# Patient Record
Sex: Male | Born: 1970
Health system: Southern US, Community
[De-identification: ages and names within clinical notes are randomized; demographics above are authoritative.]

## PROBLEM LIST (undated history)

## (undated) DIAGNOSIS — A6000 Herpesviral infection of urogenital system, unspecified: Secondary | ICD-10-CM

## (undated) DIAGNOSIS — I4891 Unspecified atrial fibrillation: Principal | ICD-10-CM

## (undated) DIAGNOSIS — G4733 Obstructive sleep apnea (adult) (pediatric): Secondary | ICD-10-CM

## (undated) DIAGNOSIS — A630 Anogenital (venereal) warts: Secondary | ICD-10-CM

## (undated) DIAGNOSIS — I4729 Other ventricular tachycardia: Secondary | ICD-10-CM

## (undated) DIAGNOSIS — I472 Ventricular tachycardia: Secondary | ICD-10-CM

## (undated) DIAGNOSIS — J309 Allergic rhinitis, unspecified: Secondary | ICD-10-CM

## (undated) DIAGNOSIS — N4340 Spermatocele of epididymis, unspecified: Secondary | ICD-10-CM

## (undated) DIAGNOSIS — M5126 Other intervertebral disc displacement, lumbar region: Secondary | ICD-10-CM

## (undated) HISTORY — DX: Spermatocele of epididymis, unspecified: N43.40

## (undated) HISTORY — DX: Other ventricular tachycardia: I47.29

## (undated) HISTORY — DX: Ventricular tachycardia: I47.2

## (undated) HISTORY — DX: Unspecified atrial fibrillation: I48.91

## (undated) HISTORY — DX: Anogenital (venereal) warts: A63.0

## (undated) HISTORY — DX: Other intervertebral disc displacement, lumbar region: M51.26

## (undated) HISTORY — DX: Herpesviral infection of urogenital system, unspecified: A60.00

## (undated) HISTORY — DX: Obstructive sleep apnea (adult) (pediatric): G47.33

## (undated) HISTORY — DX: Allergic rhinitis, unspecified: J30.9

---

## 2000-01-05 HISTORY — PX: SHOULDER ARTHROSCOPY W/ SUPERIOR LABRAL ANTERIOR POSTERIOR LESION REPAIR: SHX2402

## 2006-04-08 ENCOUNTER — Emergency Department (HOSPITAL_COMMUNITY): Admission: EM | Admit: 2006-04-08 | Discharge: 2006-04-08 | Payer: Self-pay | Admitting: Emergency Medicine

## 2010-01-28 ENCOUNTER — Encounter
Admission: RE | Admit: 2010-01-28 | Discharge: 2010-01-28 | Payer: Self-pay | Source: Home / Self Care | Attending: Family Medicine | Admitting: Family Medicine

## 2012-02-07 ENCOUNTER — Encounter: Payer: Self-pay | Admitting: Internal Medicine

## 2012-02-07 ENCOUNTER — Ambulatory Visit (INDEPENDENT_AMBULATORY_CARE_PROVIDER_SITE_OTHER): Payer: BC Managed Care – PPO | Admitting: Internal Medicine

## 2012-02-07 VITALS — BP 139/77 | HR 60 | Ht 73.0 in | Wt 208.4 lb

## 2012-02-07 DIAGNOSIS — R55 Syncope and collapse: Secondary | ICD-10-CM

## 2012-02-07 DIAGNOSIS — IMO0001 Reserved for inherently not codable concepts without codable children: Secondary | ICD-10-CM | POA: Insufficient documentation

## 2012-02-07 DIAGNOSIS — R569 Unspecified convulsions: Secondary | ICD-10-CM

## 2012-02-07 NOTE — Patient Instructions (Addendum)
Your physician has recommended that you wear a 30 day patient activated event monitor. Event monitors are medical devices that record the heart's electrical activity. Doctors most often [place] these monitors to diagnose arrhythmias. Arrhythmias are problems with the speed or rhythm of the heartbeat. The monitor is a small, portable device. You can wear one while you do your normal daily activities. This is usually used to diagnose what is causing palpitations/syncope (passing out).

## 2012-02-07 NOTE — Assessment & Plan Note (Signed)
The fact that his ECG is relatively normal and that his echo was also normal are reassuring. If the aforementioned evaluation is on enlightening, given his syncope, I may choose to undertake an MR

## 2012-02-07 NOTE — Assessment & Plan Note (Signed)
The spells are quite unusual.  The abrupt onset and  the subjective nature of the impairment of exercise and the associated palpitations make me wonder whether there is an arrhythmia associated with this. An event recorder will be helpful in clarifying the presence or absence of such.  The fact that he has shower intolerance, The syncopal episode following the sauna, and that his blood pressure fell and then rebounded post exercise make me wonder whether this is a dysautonomic syndrome. Against this is the fact that there were symptoms while in the midst of playing tennis not just in between shots. Still, we'll have to wait and see what the event recorder shows

## 2012-02-07 NOTE — Progress Notes (Signed)
ELECTROPHYSIOLOGY CONSULT NOTE  Patient ID: Alejandro Hayes, MRN: [id number], DOB/AGE: 1970/08/24 42 y.o. Admit date: (Not on file) Date of Consult: 02/07/2012  Primary Physician: Cala Bradford, MD Primary Cardiologist: [facility] Chief Complaint:  spells   HPI Alejandro Hayes is a 42 y.o. male  Seen at the request of Dr. Katrinka Blazing because of spells associated with exercise.  He is a have been exercising gentleman playing tennis swimming running etc. Beginning this summer, July-August timeframe, he began noting spells that occurred while exercising primarily. He would notice it in between tennis shots were he would find himself having to catch his breath and a sensation that his heart was racing. There were some lightheadedness with this. He noted this particularly when he was going to serve. He also had one episode of lightheadedness during a volleying   clinic while playing tennis. He's had one episode of shower intolerance.  He had an episode of syncope. This occurred following swimming and spending time in a steam room afterwards. He had a steam room and was walking out in loss consciousness falling against the wall. This is his only episode of syncope.  He has had episodes recently doing things simply as raking leaves.  He was seen by Dr. Jillyn Hidden who was concerned about possible dehydration and encourage salt and water intake. This seemed to have no mitigating impact.  He was seen by Dr. MS who undertook a a stress test and echo that were unrevealing (see below)  He has noted no systemic symptoms of fever or weight loss.        Past Medical History  Diagnosis Date  . Genital herpes   . Lumbar herniated disc   . Allergic rhinitis   . Genital warts   . Spermatocele     right      Surgical History:  Past Surgical History  Procedure Date  . Shoulder arthroscopy w/ superior labral anterior posterior lesion repair 2002    right     Home Meds: Prior to Admission medications   Not  on File      Allergies: No Known Allergies  History   Social History  . Marital Status: Married    Spouse Name: N/A    Number of Children: N/A  . Years of Education: N/A   Occupational History  . Not on file.   Social History Main Topics  . Smoking status: Former Games developer  . Smokeless tobacco: Not on file     Comment: quit 20 yrs ago  . Alcohol Use: Not on file  . Drug Use: Not on file  . Sexually Active: Not on file   Other Topics Concern  . Not on file   Social History Narrative  . No narrative on file     Family History  Problem Relation Age of Onset  . Hypertension Father   . Stroke Father 57    left optic nerve-lost visioin  . Arthritis Mother     rheumatoid  . Alzheimer's disease Paternal Grandfather 17  . Cancer Paternal Grandmother     cancer of the triceps muscle  . Alzheimer's disease Maternal Grandfather   . Diabetes Maternal Grandmother      ROS:  Please see the history of present illness.     All other systems reviewed and negative.    Physical Exam:  Blood pressure 139/77, pulse 60, height 6\' 1"  (1.854 m), weight 208 lb 6.4 oz (94.53 kg). General: Well developed, well nourished male in no acute  distress. Head: Normocephalic, atraumatic, sclera non-icteric, no xanthomas, nares are without discharge. Lymph Nodes:  none Back: without scoliosis/kyphosis , no CVA tendersness Neck: Negative for carotid bruits. JVD not elevated. Lungs: Clear bilaterally to auscultation without wheezes, rales, or rhonchi. Breathing is unlabored. Heart: RRR with S1 S2. No  murmur , rubs, or gallops appreciated. Abdomen: Soft, non-tender, non-distended with normoactive bowel sounds. No hepatomegaly. No rebound/guarding. No obvious abdominal masses. Msk:  Strength and tone appear normal for age. Extremities: No clubbing or cyanosis. No edema.  Distal pedal pulses are 2+ and equal bilaterally. Skin: Warm and Dry Neuro: Alert and oriented X 3. CN III-XII intact Grossly  normal sensory and motor function . Psych:  Responds to questions appropriately with a normal affect.       d.  EKG  sinus rhythm at 61 Intervals 16/10/39 Voltage suggestive of left ventricular hypertrophy with nonspecific T-wave changes.  Treadmill report from Dr. Champ Mungo was reviewed. Blood pressure response to exercise was normal. There is a modest change in blood pressure post exercise [id number]. There are no symptoms associated with the treadmill. Echocardiogram from [facility] was reviewed. He reports normal function and chamber sizes. No significant valvular abnormalities were appreciated   Asessment and Plan:  Sherryl Manges

## 2012-02-11 ENCOUNTER — Ambulatory Visit (INDEPENDENT_AMBULATORY_CARE_PROVIDER_SITE_OTHER): Payer: BC Managed Care – PPO

## 2012-02-11 DIAGNOSIS — R55 Syncope and collapse: Secondary | ICD-10-CM

## 2012-02-11 NOTE — Progress Notes (Signed)
Placed a 30 days  monitor on patient and went over instruction on how to use and when to return it 

## 2012-02-14 ENCOUNTER — Telehealth: Payer: Self-pay | Admitting: *Deleted

## 2012-02-14 MED ORDER — METOPROLOL SUCCINATE ER 25 MG PO TB24: 25.0000 mg | ORAL_TABLET | Freq: Every day | ORAL | Status: DC

## 2012-02-14 NOTE — Telephone Encounter (Signed)
Received rhythm strips from [facility] dated 02/13/2012. Patient just finished running and went into SVT. Discussed with Dr.Katz (DOD) and he advised that patient start Metoptolol XL 25mg  1 time per day. Patient aware of above. He will continue using monitor and keep up with his regular exercise routine. Will let Dr.Klein know.

## 2012-02-23 ENCOUNTER — Institutional Professional Consult (permissible substitution): Payer: Self-pay | Admitting: Internal Medicine

## 2012-02-23 NOTE — Telephone Encounter (Signed)
Strips reviewed by Dr. Graciela Husbands- reading was sinus tach.

## 2012-03-08 ENCOUNTER — Telehealth: Payer: Self-pay | Admitting: Cardiology

## 2012-03-08 NOTE — Telephone Encounter (Signed)
Received a call from [facility]. Alejandro Hayes has been wearing an event monitor since about 2/9 and has been noted to have SVT for which Dr. Myrtis Ser initiated metoprolol on 2/19. [facility] noted an episode of 9 beats of NSVT at 219bpm this evening. I called Alejandro Hayes who reports he was running on his treadmill during the episode. He did feel lightheaded and palpitations during this episode. He is currently feeling well without complaints. No chest pain, sob, or syncope. I advised him to call our office to arrange for follow up with Dr. Graciela Husbands. If he develops worsening or sustained symptoms he is aware that he should seek immediate medical attention.  [name], PA-C 03/08/2012 7:46 PM

## 2012-03-22 ENCOUNTER — Ambulatory Visit (INDEPENDENT_AMBULATORY_CARE_PROVIDER_SITE_OTHER): Payer: BC Managed Care – PPO | Admitting: Internal Medicine

## 2012-03-22 ENCOUNTER — Encounter: Payer: Self-pay | Admitting: Internal Medicine

## 2012-03-22 ENCOUNTER — Ambulatory Visit (HOSPITAL_COMMUNITY)
Admission: RE | Admit: 2012-03-22 | Discharge: 2012-03-22 | Disposition: A | Discharge status: Home / Self Care | Payer: BC Managed Care – PPO | Source: Ambulatory Visit | Attending: Internal Medicine | Admitting: Internal Medicine

## 2012-03-22 VITALS — BP 118/72 | HR 60 | Ht 73.0 in | Wt 205.0 lb

## 2012-03-22 DIAGNOSIS — R55 Syncope and collapse: Secondary | ICD-10-CM

## 2012-03-22 DIAGNOSIS — I472 Ventricular tachycardia: Secondary | ICD-10-CM

## 2012-03-22 DIAGNOSIS — R9431 Abnormal electrocardiogram [ECG] [EKG]: Secondary | ICD-10-CM | POA: Insufficient documentation

## 2012-03-22 MED ORDER — DILTIAZEM HCL ER COATED BEADS 120 MG PO CP24: 120.0000 mg | ORAL_CAPSULE | Freq: Every day | ORAL | Status: DC

## 2012-03-22 NOTE — Assessment & Plan Note (Signed)
No intercurrent syncope  Symptoms were improved with betaablockers

## 2012-03-22 NOTE — Progress Notes (Signed)
Patient Care Team: Cala Bradford, MD as PCP - General (Family Medicine)   HPI  Alejandro Hayes is a 42 y.o. male Seen in followup for spells associated with exercise. He is quite active and would note that while playing tennis to have spells where his heart was racing and it was difficult to catch his breath. Stress testing and echocardiography were unrevealing. He was given an event recorder Number of issues with her beta blockers have arisen including cold hands and feet  Past Medical History  Diagnosis Date  . Genital herpes   . Lumbar herniated disc   . Allergic rhinitis   . Genital warts   . Spermatocele     right    Past Surgical History  Procedure Laterality Date  . Shoulder arthroscopy w/ superior labral anterior posterior lesion repair  2002    right    Current Outpatient Prescriptions  Medication Sig Dispense Refill  . metoprolol succinate (TOPROL XL) 25 MG 24 hr tablet Take 1 tablet (25 mg total) by mouth daily.  30 tablet  1   No current facility-administered medications for this visit.    No Known Allergies  Review of Systems negative except from HPI and PMH  Physical Exam BP 118/72  Pulse 60  Ht 6\' 1"  (1.854 m)  Wt 205 lb (92.987 kg)  BMI 27.05 kg/m2 Well developed and well nourished in no acute distress  Event recorder demonstrates sinus tachycardia PVC and nonsustained ventricular tachycardia for morphology suggestive of a left bundle inferior axis    Assessment and  Plan

## 2012-03-22 NOTE — Patient Instructions (Addendum)
START DILTIAZEM 120 MG ONCE DAILY  SINGLE AVERAGE ECG AT THE ECG LAB AT [facility]

## 2012-03-22 NOTE — Assessment & Plan Note (Addendum)
The patient has been enrolled nonsustained ventricular tachycardia that have a morphology suggestive of left bundle branch block inferior axis. ECG and echo are normal. The differential diagnosis is arrhythmogenic RV cardiomyopathy. To that exclusion we'll undertake a signal average ECG. There is a recent paper from [facility] regarding incremental value of MRI which I will need to review. We spent about 40 minutes discussing the effects of the beta blockers the implications of ventricular tachycardia in the presence or absence of heart disease. At this point as I think his heart is normal, we will change his beta blocker to calcium blocker, diltiazem 120, and he is permitted to run.  We also discussed catheter ablation. He is not particularly inclined to long-term medication. I will talk to Dr. Jeannetta Nap about an appointment for catheter ablation

## 2012-03-27 ENCOUNTER — Telehealth: Payer: Self-pay | Admitting: Internal Medicine

## 2012-03-27 NOTE — Telephone Encounter (Signed)
Please return call to patient at [phone] regarding test results and possible ablation as previously discussed with Dr. Graciela Husbands

## 2012-03-27 NOTE — Telephone Encounter (Signed)
SPOKE WITH PT IS THINKING  ABOUT  PROCEEDING WITH ABLATION  REVIEWED  LAST OFFICE NOTE DR Graciela Husbands TO  DISCUSS  WITH   DR Ladona Ridgel OR DR ALLRED ALSO PT  WOULD LIKE TO SEE HOW HE DOES  ON  30 DAYS OF DILTIAZEM HAS NOT EXERCISED  WITH THIS MED HAS NOT NOTED AS MUCH BREAK TROUGH WITH THIS MED ,  WILL FORWARD TO DR Graciela Husbands FOR REVIEW .Alejandro Hayes

## 2012-03-31 NOTE — Telephone Encounter (Signed)
New Prob    C/o of heart racing, tachycardia, palpatations. Stressful/emotional situations cause his heart to suffer. Concerned and would like to speak to nurse about this and how to move forward.

## 2012-03-31 NOTE — Telephone Encounter (Signed)
Patient called because he has had several episodes of tachycardia yesterday and today, lasting 30 seconds and closed to a minute. Pt states stress triggers these episodes. Patient states he is under a lot of stress now. Pt would like to know if he can take extra medication for his tachycardia . Pt takes Cardizem CD 120 mg once a day . Pt also said that he did had less episodes of tachycardia when he was taken  Beta blockers than with Calcium channel blockers. Pt is not able to check his BP and pulse because he is in the hospital with his sick parents. Dr. Clifton James DOD recommended for pt to take an extra dose of Cardizem CD 120 mg  today and during the weekend if needed. Pt is aware that he can call the on call person, this weekend if needed.  DOD would like this message to be send to Dr. Graciela Husbands, so he can be aware of DOD's recommendations.

## 2012-04-02 NOTE — Telephone Encounter (Signed)
We should resume betablocker  toprol at 50 mg day And see how it does

## 2012-04-03 NOTE — Telephone Encounter (Signed)
Pt is aware to start Metoprolol 50 mg daily, and see how it does, per Dr Graciela Husbands MD. Pt to stop taken the Cardizem CD 120 mg. Pt verbalized understanding. Pt will used the medication he has , will take 2/ 25 mg Metoprolol one daily. Pt will call for prescription, because he wants to know if he tolerates the 50 mg dose  first  Before getting any refills.

## 2012-04-25 ENCOUNTER — Other Ambulatory Visit: Payer: Self-pay | Admitting: Emergency Medicine

## 2012-04-25 MED ORDER — METOPROLOL SUCCINATE ER 25 MG PO TB24: 25.0000 mg | ORAL_TABLET | Freq: Every day | ORAL | Status: DC

## 2012-04-27 ENCOUNTER — Other Ambulatory Visit: Payer: Self-pay | Admitting: *Deleted

## 2012-04-27 MED ORDER — METOPROLOL SUCCINATE ER 25 MG PO TB24: 25.0000 mg | ORAL_TABLET | Freq: Every day | ORAL | Status: DC

## 2012-05-03 ENCOUNTER — Ambulatory Visit: Payer: BC Managed Care – PPO | Admitting: Internal Medicine

## 2012-05-23 ENCOUNTER — Other Ambulatory Visit: Payer: Self-pay | Admitting: *Deleted

## 2012-05-23 MED ORDER — METOPROLOL SUCCINATE ER 25 MG PO TB24: 37.5000 mg | ORAL_TABLET | Freq: Every day | ORAL | Status: DC

## 2012-05-23 NOTE — Telephone Encounter (Signed)
Returned patient's call about refill of Metoprolol 25 XL. Refill was sent for one tablet daily when last telephone encounter states one tablet twice daily. Patient stated he is actually taking 1.5 tablets daily and having no problems with this dose. [employer] told him to have [place] Teacher, English as a foreign language) to resend the correct rx so his insurance will cover his medication.  Metoprolol XL 25mg , 37.5 (1.5 tabs) one daily by mouth, #[id number]  Micki Riley, CMA

## 2012-07-04 DIAGNOSIS — I4891 Unspecified atrial fibrillation: Secondary | ICD-10-CM

## 2012-07-04 HISTORY — DX: Unspecified atrial fibrillation: I48.91

## 2012-07-20 ENCOUNTER — Ambulatory Visit (INDEPENDENT_AMBULATORY_CARE_PROVIDER_SITE_OTHER): Payer: BC Managed Care – PPO | Admitting: Physician Assistant

## 2012-07-20 ENCOUNTER — Encounter: Payer: Self-pay | Admitting: Physician Assistant

## 2012-07-20 VITALS — BP 110/62 | HR 89 | Ht 73.0 in | Wt 202.0 lb

## 2012-07-20 DIAGNOSIS — I472 Ventricular tachycardia: Secondary | ICD-10-CM

## 2012-07-20 DIAGNOSIS — I4891 Unspecified atrial fibrillation: Secondary | ICD-10-CM | POA: Insufficient documentation

## 2012-07-20 LAB — BASIC METABOLIC PANEL
BUN: 13 mg/dL (ref 6–23)
Calcium: 9.5 mg/dL (ref 8.4–10.5)
GFR: 71.06 mL/min (ref >60.00)
Glucose, Bld: 67 mg/dL — ABNORMAL LOW (ref 70–99)
Sodium: 139 mEq/L (ref 135–145)

## 2012-07-20 LAB — CBC WITH DIFFERENTIAL/PLATELET
Basophils Absolute: 0 10*3/uL (ref 0.0–0.1)
Hemoglobin: 17.3 g/dL — ABNORMAL HIGH (ref 13.0–17.0)
Lymphocytes Relative: 29 % (ref 12.0–46.0)
Monocytes Relative: 11.6 % (ref 3.0–12.0)
Platelets: 215 10*3/uL (ref 150.0–400.0)
RDW: 12.5 % (ref 11.5–14.6)

## 2012-07-20 LAB — TSH: TSH: 0.93 u[IU]/mL (ref 0.35–5.50)

## 2012-07-20 NOTE — Progress Notes (Signed)
[address] [place], [place]  [address] Phone: [phone] Fax:  [phone]  Date:  07/20/2012   ID:  Alejandro Hayes, DOB 08/20/1970, MRN [id number]  PCP:  WHITE,CYNTHIA S, MD   Electrophysiologist:  Dr. Steven Klein    History of Present Illness: Alejandro Hayes is a 41 y.o. male who returns for the evaluation of palpitations.  He has a hx of palpitations with exercise. Exercise treadmill test and echocardiogram done with Dr. Skains at [facility] were both normal. He was then evaluated by Dr. Klein. He apparently had nonsustained ventricular tachycardia on an event monitor. Morphology suggested LBBB inferior axis. Patient was placed on diltiazem. There was some discussion regarding whether or not to proceed with MRI. There was also discussion of possibly proceeding with catheter ablation. A signal averaged ECG was ordered. I do not have those results available to me.  He called in to the office in March with recurrent symptoms and his diltiazem was switched to metoprolol.  Over the last week or so, he has noted an irregular heartbeat as well as increased fatigue and dyspnea with exertion. He describes NYHA class II symptoms. He denies syncope or chest pain. He denies edema. He typically exercises several days week.  Wt Readings from Last 3 Encounters:  03/22/12 205 lb (92.987 kg)  02/07/12 208 lb 6.4 oz (94.53 kg)     Past Medical History  Diagnosis Date  . Genital herpes   . Lumbar herniated disc   . Allergic rhinitis   . Genital warts   . Spermatocele     right  . NSVT (nonsustained ventricular tachycardia)   . Atrial fibrillation 07/2012    Current Outpatient Prescriptions  Medication Sig Dispense Refill  . diltiazem (CARDIZEM CD) 120 MG 24 hr capsule Take 1 capsule (120 mg total) by mouth daily.  30 capsule  12  . metoprolol succinate (TOPROL XL) 25 MG 24 hr tablet Take 1.5 tablets (37.5 mg total) by mouth daily.  45 tablet  5   No current  facility-administered medications for this visit.    Allergies:   No Known Allergies  Social History:  The patient  reports that he has quit smoking. He does not have any smokeless tobacco history on file.   ROS:  Please see the history of present illness.      All other systems reviewed and negative.   PHYSICAL EXAM: VS:  BP 110/62  Pulse 89  Ht 6' 1" (1.854 m)  Wt 202 lb (91.627 kg)  BMI 26.66 kg/m2 Well nourished, well developed, in no acute distress HEENT: normal Neck: no JVD Endocrine: No thyromegaly Cardiac:  normal S1, S2; irregularly irregular rhythm; no murmur Lungs:  clear to auscultation bilaterally, no wheezing, rhonchi or rales Abd: soft, nontender, no hepatomegaly Ext: no edema Skin: warm and dry Neuro:  CNs 2-12 intact, no focal abnormalities noted  EKG:  Atrial fibrillation, HR 89     ASSESSMENT AND PLAN:  1. Atrial Fibrillation:  CHADS2-VASc=0.  With his history of nonsustained ventricular tachycardia, I discussed his case further with Dr. Klein. At this point, he recommends restoration of normal sinus rhythm. We will place him on Xarelto 20 mg daily. After 3 weeks of uninterrupted anticoagulation, we will proceed with elective DCCV. He will then need to remain on Xarelto for 4 weeks post cardioversion. I will also have him follow up with Dr. Klein. If he has recurrent atrial fibrillation, we may need to consider antiarrhythmic drug   therapy. 2. Nonsustained Ventricular Tachycardia: Continue beta blocker. Follow up with Dr. Klein. 3. Disposition: Arrange follow up with Dr. Klein in 6-8 weeks.  Signed, Scott Weaver, PA-C  07/20/2012 8:16 AM   

## 2012-07-20 NOTE — Patient Instructions (Signed)
Will obtain labs today and call you with the results (bmet/cbc/tsh)  Will have you start on Xarelto 20 mg daily  Your physician recommends that you schedule a follow-up appointment in: 6-8 weeks with Dr Graciela Husbands, will call you with an appointment   Will arrange a cardioversion after Aug 11, will call you with an appointment   Electrical Cardioversion Cardioversion is the delivery of a jolt of electricity to change the rhythm of the heart. Sticky patches or metal paddles are placed on the chest to deliver the electricity from a special device. This is done to restore a normal rhythm. A rhythm that is too fast or not regular keeps the heart from pumping well. Compared to medicines used to change an abnormal rhythm, cardioversion is faster and works better. It is also unpleasant and may dislodge blood clots from the heart. WHEN WOULD THIS BE DONE?  In an emergency:  There is low or no blood pressure as a result of the heart rhythm.  Normal rhythm must be restored as fast as possible to protect the brain and heart from further damage.  It may save a life.  For less serious heart rhythms, such as atrial fibrillation or flutter, in which:  The heart is beating too fast or is not regular.  The heart is still able to pump enough blood, but not as well as it should.  Medicine to change the rhythm has not worked.  It is safe to wait in order to allow time for preparation. LET YOUR CAREGIVER KNOW ABOUT:   Every medicine you are taking. It is very important to do this! Know when to take or stop taking any of them.  Any time in the past that you have felt your heart was not beating normally. RISKS AND COMPLICATIONS   Clots may form in the chambers of the heart if it is beating too fast. These clots may be dislodged during the procedure and travel to other parts of the body.  There is risk of a stroke during and after the procedure if a clot moves. Blood thinners lower this risk.  You may  have a special test of your heart (TEE) to make sure there are no clots in your heart. BEFORE THE PROCEDURE   You may have some tests to see how well your heart is working.  You may start taking blood thinners so your blood does not clot as easily.  Other drugs may be given to help your heart work better. PROCEDURE (SCHEDULED)  The procedure is typically done in a hospital by a heart doctor (cardiologist).  You will be told when and where to go.  You may be given some medicine through an intravenous (IV) access to reduce discomfort and make you sleepy before the procedure.  Your whole body may move when the shock is delivered. Your chest may feel sore.  You may be able to go home after a few hours. Your heart rhythm will be watched to make sure it does not change. HOME CARE INSTRUCTIONS   Only take medicine as directed by your caregiver. Be sure you understand how and when to take your medicine.  Learn how to feel your pulse and check it often.  Limit your activity for 48 hours.  Avoid caffeine and other stimulants as directed. SEEK MEDICAL CARE IF:   You feel like your heart is beating too fast or your pulse is not regular.  You have any questions about your medicines.  You have bleeding  that will not stop. SEEK IMMEDIATE MEDICAL CARE IF:   You are dizzy or feel faint.  It is hard to breathe or you feel short of breath.  There is a change in discomfort in your chest.  Your speech is slurred or you have trouble moving your arm or leg on one side.  You get a muscle cramp.  Your fingers or toes turn cold or blue. MAKE SURE YOU:   Understand these instructions.  Will watch your condition.  Will get help right away if you are not doing well or get worse. Document Released: 12/11/2001 Document Revised: 03/15/2011 Document Reviewed: 04/12/2007 [employer] Patient Information 2014 [place], [place].

## 2012-07-21 ENCOUNTER — Other Ambulatory Visit: Payer: Self-pay | Admitting: *Deleted

## 2012-07-21 MED ORDER — RIVAROXABAN 20 MG PO TABS: 20.0000 mg | ORAL_TABLET | Freq: Every day | ORAL | Status: DC

## 2012-07-27 ENCOUNTER — Encounter: Payer: Self-pay | Admitting: *Deleted

## 2012-07-27 ENCOUNTER — Telehealth: Payer: Self-pay | Admitting: Internal Medicine

## 2012-07-27 DIAGNOSIS — I4891 Unspecified atrial fibrillation: Secondary | ICD-10-CM

## 2012-07-27 NOTE — Telephone Encounter (Signed)
Follow Up     Following up on  Scheduling cardioversion. Pt is requesting the week of 8/11 so his wife can be with him. Please call.

## 2012-07-27 NOTE — Telephone Encounter (Signed)
Patient requesting scheduling for Cardioversion be planned at least two weeks in advance so patient's wife can be present. Cardioversion scheduled for 08/18/12 at 1:00pm. Patient given instructions and he will come in on 08/16/12 for pre-procedure labwork and letter and to speak with RN regarding procedure.  Patient advised to continue Xarelto as directed and to notify MD office or seek medical care for any symptoms such as increased SOB, heart rate irregularity, dizziness, fatigue, chest pain/discomfort or any other changes in current status. Patient verbalized understanding and agreement with plan.

## 2012-07-28 ENCOUNTER — Other Ambulatory Visit: Payer: Self-pay | Admitting: Internal Medicine

## 2012-07-28 DIAGNOSIS — I4891 Unspecified atrial fibrillation: Secondary | ICD-10-CM

## 2012-08-07 ENCOUNTER — Ambulatory Visit: Payer: BC Managed Care – PPO | Admitting: Nurse Practitioner

## 2012-08-16 ENCOUNTER — Other Ambulatory Visit (INDEPENDENT_AMBULATORY_CARE_PROVIDER_SITE_OTHER): Payer: BC Managed Care – PPO

## 2012-08-16 DIAGNOSIS — I4891 Unspecified atrial fibrillation: Secondary | ICD-10-CM

## 2012-08-16 LAB — BASIC METABOLIC PANEL
CO2: 29 mEq/L (ref 19–32)
Calcium: 9 mg/dL (ref 8.4–10.5)
Chloride: 101 mEq/L (ref 96–112)
Sodium: 136 mEq/L (ref 135–145)

## 2012-08-16 LAB — CBC
Platelets: 205 10*3/uL (ref 150.0–400.0)
RBC: 5.05 Mil/uL (ref 4.22–5.81)

## 2012-08-16 LAB — PROTIME-INR: INR: 1.2 ratio — ABNORMAL HIGH (ref 0.8–1.0)

## 2012-08-18 ENCOUNTER — Encounter (HOSPITAL_COMMUNITY)
Admission: RE | Disposition: A | Discharge status: Home / Self Care | Payer: Self-pay | Source: Ambulatory Visit | Attending: Internal Medicine

## 2012-08-18 ENCOUNTER — Encounter (HOSPITAL_COMMUNITY): Payer: Self-pay | Admitting: Anesthesiology

## 2012-08-18 ENCOUNTER — Encounter (HOSPITAL_COMMUNITY): Payer: Self-pay | Admitting: *Deleted

## 2012-08-18 ENCOUNTER — Ambulatory Visit (HOSPITAL_COMMUNITY): Payer: BC Managed Care – PPO | Admitting: Anesthesiology

## 2012-08-18 ENCOUNTER — Ambulatory Visit (HOSPITAL_COMMUNITY)
Admission: RE | Admit: 2012-08-18 | Discharge: 2012-08-18 | Disposition: A | Discharge status: Home / Self Care | Payer: BC Managed Care – PPO | Source: Ambulatory Visit | Attending: Internal Medicine | Admitting: Internal Medicine

## 2012-08-18 DIAGNOSIS — A6000 Herpesviral infection of urogenital system, unspecified: Secondary | ICD-10-CM | POA: Insufficient documentation

## 2012-08-18 DIAGNOSIS — Z87891 Personal history of nicotine dependence: Secondary | ICD-10-CM | POA: Insufficient documentation

## 2012-08-18 DIAGNOSIS — N4340 Spermatocele of epididymis, unspecified: Secondary | ICD-10-CM | POA: Insufficient documentation

## 2012-08-18 DIAGNOSIS — I472 Ventricular tachycardia, unspecified: Secondary | ICD-10-CM | POA: Insufficient documentation

## 2012-08-18 DIAGNOSIS — Z79899 Other long term (current) drug therapy: Secondary | ICD-10-CM | POA: Insufficient documentation

## 2012-08-18 DIAGNOSIS — I4891 Unspecified atrial fibrillation: Secondary | ICD-10-CM

## 2012-08-18 DIAGNOSIS — I4729 Other ventricular tachycardia: Secondary | ICD-10-CM | POA: Insufficient documentation

## 2012-08-18 DIAGNOSIS — J309 Allergic rhinitis, unspecified: Secondary | ICD-10-CM | POA: Insufficient documentation

## 2012-08-18 DIAGNOSIS — M5126 Other intervertebral disc displacement, lumbar region: Secondary | ICD-10-CM | POA: Insufficient documentation

## 2012-08-18 HISTORY — PX: CARDIOVERSION: SHX1299

## 2012-08-18 SURGERY — CARDIOVERSION
Anesthesia: Monitor Anesthesia Care

## 2012-08-18 MED ORDER — LIDOCAINE HCL (CARDIAC) 20 MG/ML IV SOLN
INTRAVENOUS | Status: DC | PRN
  Administered 2012-08-18: 30 mg via INTRAVENOUS

## 2012-08-18 MED ORDER — SODIUM CHLORIDE 0.9 % IV SOLN
INTRAVENOUS | Status: DC
  Administered 2012-08-18: 500 mL via INTRAVENOUS

## 2012-08-18 MED ORDER — PROPOFOL 10 MG/ML IV BOLUS
INTRAVENOUS | Status: DC | PRN
  Administered 2012-08-18: 100 mg via INTRAVENOUS

## 2012-08-18 MED ORDER — SODIUM CHLORIDE 0.9 % IV SOLN
INTRAVENOUS | Status: DC | PRN
  Administered 2012-08-18: 13:00:00 via INTRAVENOUS

## 2012-08-18 NOTE — CV Procedure (Signed)
Preop Dx afib   Post op DX  NSR   Procedure  DC Cardioversion   Pt was sedated by anesthesia receiving 100 mg Propafol  A synchronized shock 100 joules restored sinus Rhythm   Pt tolerated without difficulty   

## 2012-08-18 NOTE — Anesthesia Postprocedure Evaluation (Signed)
  Anesthesia Post-op Note  Patient: Alejandro Hayes  Procedure(s) Performed: Procedure(s): CARDIOVERSION (N/A)  Patient Location: Endoscopy Unit  Anesthesia Type:General  Level of Consciousness: awake, alert  and oriented  Airway and Oxygen Therapy: Patient Spontanous Breathing and Patient connected to nasal cannula oxygen  Post-op Pain: none  Post-op Assessment: Post-op Vital signs reviewed, Patient's Cardiovascular Status Stable, Respiratory Function Stable, Patent Airway, No signs of Nausea or vomiting, Adequate PO intake and Pain level controlled  Post-op Vital Signs: Reviewed and stable  Complications: No apparent anesthesia complications

## 2012-08-18 NOTE — Transfer of Care (Signed)
Immediate Anesthesia Transfer of Care Note  Patient: Alejandro Hayes  Procedure(s) Performed: Procedure(s): CARDIOVERSION (N/A)  Patient Location: Endoscopy Unit  Anesthesia Type:General  Level of Consciousness: awake, alert  and oriented  Airway & Oxygen Therapy: Patient Spontanous Breathing and Patient connected to nasal cannula oxygen  Post-op Assessment: Report given to PACU RN, Post -op Vital signs reviewed and stable, Patient moving all extremities and Patient moving all extremities X 4  Post vital signs: Reviewed and stable  Complications: No apparent anesthesia complications

## 2012-08-18 NOTE — Anesthesia Preprocedure Evaluation (Signed)
Anesthesia Evaluation  Patient identified by MRN, date of birth, ID band Patient awake    Reviewed: Allergy & Precautions, H&P , NPO status , Patient's Chart, lab work & pertinent test results  Airway  TM Distance: >3 FB Neck ROM: Full    Dental   Pulmonary former smoker,  breath sounds clear to auscultation        Cardiovascular + dysrhythmias Atrial Fibrillation Rhythm:Irregular Rate:Normal     Neuro/Psych    GI/Hepatic   Endo/Other    Renal/GU      Musculoskeletal   Abdominal   Peds  Hematology   Anesthesia Other Findings   Reproductive/Obstetrics                           Anesthesia Physical Anesthesia Plan  ASA: III  Anesthesia Plan: General   Post-op Pain Management:    Induction: Intravenous  Airway Management Planned: Mask  Additional Equipment:   Intra-op Plan:   Post-operative Plan:   Informed Consent: I have reviewed the patients History and Physical, chart, labs and discussed the procedure including the risks, benefits and alternatives for the proposed anesthesia with the patient or authorized representative who has indicated his/her understanding and acceptance.     Plan Discussed with: CRNA and Surgeon  Anesthesia Plan Comments:         Anesthesia Quick Evaluation

## 2012-08-18 NOTE — H&P (View-Only) (Signed)
[address], [address] [place], [place]  [address] Phone: [phone] Fax:  [phone]  Date:  07/20/2012   ID:  Alejandro Hayes, DOB May 26, 1970, MRN [id number]  PCP:  Cala Bradford, MD   Electrophysiologist:  Dr. Sherryl Manges    History of Present Illness: Alejandro Hayes is a 42 y.o. male who returns for the evaluation of palpitations.  He has a hx of palpitations with exercise. Exercise treadmill test and echocardiogram done with Dr. Anne Fu at [facility] were both normal. He was then evaluated by Dr. Graciela Husbands. He apparently had nonsustained ventricular tachycardia on an event monitor. Morphology suggested LBBB inferior axis. Patient was placed on diltiazem. There was some discussion regarding whether or not to proceed with MRI. There was also discussion of possibly proceeding with catheter ablation. A signal averaged ECG was ordered. I do not have those results available to me.  He called in to the office in March with recurrent symptoms and his diltiazem was switched to metoprolol.  Over the last week or so, he has noted an irregular heartbeat as well as increased fatigue and dyspnea with exertion. He describes NYHA class II symptoms. He denies syncope or chest pain. He denies edema. He typically exercises several days week.  Wt Readings from Last 3 Encounters:  03/22/12 205 lb (92.987 kg)  02/07/12 208 lb 6.4 oz (94.53 kg)     Past Medical History  Diagnosis Date  . Genital herpes   . Lumbar herniated disc   . Allergic rhinitis   . Genital warts   . Spermatocele     right  . NSVT (nonsustained ventricular tachycardia)   . Atrial fibrillation 07/2012    Current Outpatient Prescriptions  Medication Sig Dispense Refill  . diltiazem (CARDIZEM CD) 120 MG 24 hr capsule Take 1 capsule (120 mg total) by mouth daily.  30 capsule  12  . metoprolol succinate (TOPROL XL) 25 MG 24 hr tablet Take 1.5 tablets (37.5 mg total) by mouth daily.  45 tablet  5   No current  facility-administered medications for this visit.    Allergies:   No Known Allergies  Social History:  The patient  reports that he has quit smoking. He does not have any smokeless tobacco history on file.   ROS:  Please see the history of present illness.      All other systems reviewed and negative.   PHYSICAL EXAM: VS:  BP 110/62  Pulse 89  Ht 6\' 1"  (1.854 m)  Wt 202 lb (91.627 kg)  BMI 26.66 kg/m2 Well nourished, well developed, in no acute distress HEENT: normal Neck: no JVD Endocrine: No thyromegaly Cardiac:  normal S1, S2; irregularly irregular rhythm; no murmur Lungs:  clear to auscultation bilaterally, no wheezing, rhonchi or rales Abd: soft, nontender, no hepatomegaly Ext: no edema Skin: warm and dry Neuro:  CNs 2-12 intact, no focal abnormalities noted  EKG:  Atrial fibrillation, HR 89     ASSESSMENT AND PLAN:  1. Atrial Fibrillation:  CHADS2-VASc=0.  With his history of nonsustained ventricular tachycardia, I discussed his case further with Dr. Graciela Husbands. At this point, he recommends restoration of normal sinus rhythm. We will place him on Xarelto 20 mg daily. After 3 weeks of uninterrupted anticoagulation, we will proceed with elective DCCV. He will then need to remain on Xarelto for 4 weeks post cardioversion. I will also have him follow up with Dr. Graciela Husbands. If he has recurrent atrial fibrillation, we may need to consider antiarrhythmic drug  therapy. 2. Nonsustained Ventricular Tachycardia: Continue beta blocker. Follow up with Dr. Graciela Husbands. 3. Disposition: Arrange follow up with Dr. Graciela Husbands in 6-8 weeks.  Signed, Tereso Newcomer, PA-C  07/20/2012 8:16 AM

## 2012-08-18 NOTE — Interval H&P Note (Signed)
History and Physical Interval Note:  08/18/2012 1:10 PM  Alejandro Hayes  has presented today for surgery, with the diagnosis of AFIB  The various methods of treatment have been discussed with the patient and family. After consideration of risks, benefits and other options for treatment, the patient has consented to  Procedure(s): CARDIOVERSION (N/A) as a surgical intervention .  The patient's history has been reviewed, patient examined, no change in status, stable for surgery.  I have reviewed the patient's chart and labs.  Questions were answered to the patient's satisfaction.     Sherryl Manges

## 2012-08-21 ENCOUNTER — Encounter (HOSPITAL_COMMUNITY): Payer: Self-pay | Admitting: Internal Medicine

## 2012-08-31 ENCOUNTER — Telehealth: Payer: Self-pay | Admitting: Internal Medicine

## 2012-08-31 NOTE — Telephone Encounter (Signed)
Spoke with pt, aware samples placed at the front desk for pick up 

## 2012-08-31 NOTE — Telephone Encounter (Signed)
New problem    Pt need samples of Xlerelta please call pt.

## 2012-09-07 ENCOUNTER — Encounter: Payer: Self-pay | Admitting: *Deleted

## 2012-09-11 ENCOUNTER — Encounter: Payer: Self-pay | Admitting: Internal Medicine

## 2012-09-11 ENCOUNTER — Ambulatory Visit (INDEPENDENT_AMBULATORY_CARE_PROVIDER_SITE_OTHER): Payer: BC Managed Care – PPO | Admitting: Internal Medicine

## 2012-09-11 VITALS — BP 132/81 | HR 66 | Ht 74.0 in | Wt 205.0 lb

## 2012-09-11 DIAGNOSIS — I4891 Unspecified atrial fibrillation: Secondary | ICD-10-CM

## 2012-09-11 DIAGNOSIS — I472 Ventricular tachycardia: Secondary | ICD-10-CM

## 2012-09-11 NOTE — Progress Notes (Signed)
Patient Care Team: Cala Bradford, MD as PCP - General (Family Medicine)   HPI  Alejandro Hayes is a 42 y.o. male Seen in followup for ventricular ectopy it appeared to be left bundle inferior axis. ECG and echo were normal.SAECG niormal  He subsequently presented with atrial fibrillation and underwent cardioversion  He has had no recurrent atrial fibrillation; he was aware her symptomatically in its onset and has never had before. He notes that his mother has atrial fibrillation.  He is also largely been PVC free;  his beta blockers were discontinued at discharge from hospital.  Past Medical History  Diagnosis Date  . Genital herpes   . Lumbar herniated disc   . Allergic rhinitis   . Genital warts   . Spermatocele     right  . NSVT (nonsustained ventricular tachycardia)   . Atrial fibrillation 07/2012    Past Surgical History  Procedure Laterality Date  . Shoulder arthroscopy w/ superior labral anterior posterior lesion repair  2002    right  . Cardioversion N/A 08/18/2012    Procedure: CARDIOVERSION;  Surgeon: Duke Salvia, MD;  Location: [facility] ENDOSCOPY;  Service: Cardiovascular;  Laterality: N/A;    Current Outpatient Prescriptions  Medication Sig Dispense Refill  . Rivaroxaban (XARELTO) 20 MG TABS Take 1 tablet (20 mg total) by mouth daily.  30 tablet  2   No current facility-administered medications for this visit.    No Known Allergies  Review of Systems negative except from HPI and PMH  Physical Exam BP 132/81  Pulse 66  Ht 6\' 2"  (1.88 m)  Wt 205 lb (92.987 kg)  BMI 26.31 kg/m2 Well developed and nourished in no acute distress HENT normal Neck supple with JVP-flat Clear Regular rate and rhythm, no murmurs or gallops Abd-soft with active BS No Clubbing cyanosis edema Skin-warm and dry A & Oriented  Grossly normal sensory and motor function     Assessment and  Plan

## 2012-09-11 NOTE — Assessment & Plan Note (Signed)
The patient has not had significant ventricular ectopy symptoms. He will begin exercising again and will hold off on the reinitiation of beta blockers unless we see whether they recur with exercise.  I'm also inclined to think in terms of stress testing.Marland Kitchen

## 2012-09-11 NOTE — Patient Instructions (Addendum)
Your physician recommends that you schedule a follow-up appointment in: 2-3 months with Dr. Klein.  Your physician recommends that you continue on your current medications as directed. Please refer to the Current Medication list given to you today.   

## 2012-09-11 NOTE — Assessment & Plan Note (Signed)
We will plan to have him discontinue his Rivaroxaban in 3 more days. We will await to see if he has recurrences. This event occurred in the wake of drinking coffee. This raises the possibility that was vagal in origin perhaps made more likely by his concomitant use of a beta blocker. His mother also has atrial fibrillation raising the possibility of genetic issue.  The implications of atrial fibrillation may be to defer catheter ablation for his RVOT VT if medical therapy is needed for both. Still, he is disinclined to take medications long-term.

## 2012-09-21 ENCOUNTER — Encounter: Payer: Self-pay | Admitting: Internal Medicine

## 2012-10-16 ENCOUNTER — Ambulatory Visit (INDEPENDENT_AMBULATORY_CARE_PROVIDER_SITE_OTHER): Payer: BC Managed Care – PPO | Admitting: *Deleted

## 2012-10-16 ENCOUNTER — Telehealth: Payer: Self-pay | Admitting: Internal Medicine

## 2012-10-16 VITALS — BP 122/72 | HR 58 | Ht 73.0 in | Wt 206.0 lb

## 2012-10-16 DIAGNOSIS — R002 Palpitations: Secondary | ICD-10-CM

## 2012-10-16 DIAGNOSIS — I472 Ventricular tachycardia: Secondary | ICD-10-CM

## 2012-10-16 NOTE — Progress Notes (Signed)
Pt had had an episode of fast heart rate/ see prior note from today. Pt feeling well now. Dr Graciela Husbands reviewed/ no further orders, continue current care plan. Pt informed and ambulated to exit without further needs.

## 2012-10-16 NOTE — Telephone Encounter (Signed)
Patient states that over weekend he noticed increased heart rate. He states that he experienced slight pain when this occurred, but he states that it was not heart attack pain. He also describes carrying his luggage on Sunday and developing SOB when carrying it downstairs.  He tells me that he has restarted his Toprol 25mg  daily and would like Dr. Graciela Husbands to be aware. I have made appointment for him to come in as nurse visit to have EKG performed, then go from there on further needs. Patient is agreeable to plan.

## 2012-10-16 NOTE — Telephone Encounter (Signed)
New Problem  Pt states he was recently in Afib and was shocked back into rhythm/// Rapid heart rate at dinner over the weekend multiple times , not exercise related.. Pt currently taking a beta blocker.. States he will increase the dosage. Pt wants to be sure that what he is feeling is tachycardia.Marland Kitchen requesting an EKG... Please call back to discuss.

## 2012-11-01 ENCOUNTER — Encounter: Payer: Self-pay | Admitting: Nurse Practitioner

## 2012-11-06 ENCOUNTER — Encounter: Payer: Self-pay | Admitting: Internal Medicine

## 2012-11-06 ENCOUNTER — Other Ambulatory Visit: Payer: Self-pay | Admitting: *Deleted

## 2012-11-06 ENCOUNTER — Ambulatory Visit (INDEPENDENT_AMBULATORY_CARE_PROVIDER_SITE_OTHER): Payer: BC Managed Care – PPO | Admitting: Internal Medicine

## 2012-11-06 DIAGNOSIS — R002 Palpitations: Secondary | ICD-10-CM

## 2012-11-06 DIAGNOSIS — I472 Ventricular tachycardia: Secondary | ICD-10-CM

## 2012-11-06 NOTE — Progress Notes (Signed)
Exercise Treadmill Test  Pre-Exercise Testing Evaluation Rhythm: normal sinus  Rate: 65 bpm     Test  Exercise Tolerance Test Ordering MD: Sherryl Manges, MD  Interpreting MD: Sherryl Manges, MD  Unique Test No: 1  Treadmill:  1  Indication for ETT: palpitations VT Contraindication to ETT: No   Stress Modality: exercise - treadmill  Cardiac Imaging Performed: non   Protocol: standard Bruce - maximal  Max BP:  221/81  Max MPHR (bpm):  178 85% MPR (bpm):  171  MPHR obtained (bpm):  151 % MPHR obtained:  85  Reached 85% MPHR (min:sec):  yes Total Exercise Time (min-sec):  8:28  Workload in METS:  13.4 Borg Scale: 17  Reason ETT Terminated:  Hypertension    ST Segment Analysis At Rest: non-specific ST segment slurring With Exercise: non-specific ST changes  Other Information Arrhythmia:  No Angina during ETT:  absent (0) Quality of ETT:  non-diagnostic  ETT Interpretation:  No induced ventricular ectopy Hypertensive response to exercise Comments:    Recommendations: Three is no clear data about prognosis or therapeutic efforts for exercise induced hypertension No arrhythmia  Continue bb

## 2012-11-09 ENCOUNTER — Other Ambulatory Visit: Payer: Self-pay

## 2012-12-12 ENCOUNTER — Ambulatory Visit: Payer: BC Managed Care – PPO | Admitting: Internal Medicine

## 2013-02-21 ENCOUNTER — Other Ambulatory Visit: Payer: Self-pay | Admitting: Internal Medicine

## 2013-02-23 NOTE — Telephone Encounter (Signed)
Is this ok to refill? Thanks, MI 

## 2013-02-26 NOTE — Telephone Encounter (Signed)
Yes. Enough to get pt through until follow up OV on 03/30/13.

## 2013-03-30 ENCOUNTER — Encounter: Payer: Self-pay | Admitting: Internal Medicine

## 2013-03-30 ENCOUNTER — Ambulatory Visit (INDEPENDENT_AMBULATORY_CARE_PROVIDER_SITE_OTHER): Payer: BC Managed Care – PPO | Admitting: Internal Medicine

## 2013-03-30 VITALS — BP 127/78 | HR 61 | Ht 73.0 in | Wt 205.1 lb

## 2013-03-30 DIAGNOSIS — I4891 Unspecified atrial fibrillation: Secondary | ICD-10-CM

## 2013-03-30 DIAGNOSIS — I472 Ventricular tachycardia: Secondary | ICD-10-CM

## 2013-03-30 DIAGNOSIS — I4729 Other ventricular tachycardia: Secondary | ICD-10-CM

## 2013-03-30 NOTE — Progress Notes (Signed)
      Patient Care Team: Cala Bradfordynthia S White, MD as PCP - General (Family Medicine)   HPI  Alejandro Hayes is a 43 y.o. male Seen in followup for ventricular ectopy it appeared to be left bundle inferior axis. ECG and echo were normal.SAECG niormal  He subsequently presented with atrial fibrillation and underwent cardioversion    He has had no recurrent atrial fibrillation; he was aware   symptomatically in its onset and has never had before. He notes that his mother has atrial fibrillation.  He is also largely been PVC free;   He is continued low-dose beta blockers. He is tolerating without complication.  He tells me that his father died a year ago of esophageal cancer.  Past Medical History  Diagnosis Date  . Genital herpes   . Lumbar herniated disc   . Allergic rhinitis   . Genital warts   . Spermatocele     right  . NSVT (nonsustained ventricular tachycardia)   . Atrial fibrillation 07/2012    Past Surgical History  Procedure Laterality Date  . Shoulder arthroscopy w/ superior labral anterior posterior lesion repair  2002    right  . Cardioversion N/A 08/18/2012    Procedure: CARDIOVERSION;  Surgeon: Duke SalviaSteven C Klein, MD;  [facility];  Service: Cardiovascular;  Laterality: N/A;    Current Outpatient Prescriptions  Medication Sig Dispense Refill  . metoprolol succinate (TOPROL-XL) 25 MG 24 hr tablet TAKE 1.5 TABLETS (37.5 MGTOTAL) BY MOUTH DAILY.  45 tablet  0   No current facility-administered medications for this visit.    No Known Allergies  Review of Systems negative except from HPI and PMH  Physical Exam BP 127/78  Pulse 61  Ht 6\' 1"  (1.854 m)  Wt 205 lb 1.9 oz (93.042 kg)  BMI 27.07 kg/m2 Well developed and nourished in no acute distress HENT normal Neck supple with JVP-flat Clear Regular rate and rhythm, early systolic murmur  Abd-soft with active BS No Clubbing cyanosis edema Skin-warm and dry A & Oriented  Grossly normal sensory and  motor function y ECG demonstrates sinus rhythm at 61 16/10/39 J-point elevation in leads 1 and L.  Unfortunately there was lead reversal.  Assessment and  Plan   PVCs Atrial fibrillation patient is asymptomatic. We'll continue him on metoprolol.

## 2013-03-30 NOTE — Patient Instructions (Signed)
Your physician recommends that you continue on your current medications as directed. Please refer to the Current Medication list given to you today.  Your physician wants you to follow-up in: 1 year with Dr. Klein.  You will receive a reminder letter in the mail two months in advance. If you don't receive a letter, please call our office to schedule the follow-up appointment.  

## 2013-04-16 ENCOUNTER — Other Ambulatory Visit: Payer: Self-pay | Admitting: Internal Medicine

## 2013-07-24 ENCOUNTER — Telehealth: Payer: Self-pay | Admitting: Internal Medicine

## 2013-07-24 NOTE — Telephone Encounter (Signed)
New message    Patient calling need a note stating he's clear for ventricular tachycardia -  approval CDL license.    [facility] is asking for this    Fax # [phone].   Attention [employer]everly Nott.

## 2013-07-31 ENCOUNTER — Encounter: Payer: Self-pay | Admitting: Internal Medicine

## 2013-07-31 NOTE — Telephone Encounter (Signed)
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  [facility]--[phone] attn Katharina CaperBeverly Knott

## 2013-07-31 NOTE — Telephone Encounter (Signed)
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 Please fax [facility]--[phone] attn Katharina CaperBeverly Knott

## 2013-07-31 NOTE — Telephone Encounter (Signed)
This encounter was created in error - please disregard.

## 2013-08-01 ENCOUNTER — Encounter: Payer: Self-pay | Admitting: *Deleted

## 2013-08-01 NOTE — Telephone Encounter (Addendum)
Faxed letter to requested location and informed patient.

## 2013-12-19 ENCOUNTER — Other Ambulatory Visit: Payer: Self-pay | Admitting: Internal Medicine

## 2014-03-12 ENCOUNTER — Ambulatory Visit: Payer: Self-pay | Admitting: Nurse Practitioner

## 2014-03-12 ENCOUNTER — Telehealth: Payer: Self-pay | Admitting: Internal Medicine

## 2014-03-12 NOTE — Telephone Encounter (Signed)
New Message  Pt called about earlier appt w/ Graciela HusbandsKlein (currently set at 3/29). Per pt- experiencing bouts of A-fib. No open apt before June. Pt requested to speak w/ Rn. Please call back and discuss.

## 2014-03-12 NOTE — Telephone Encounter (Signed)
Wife took pulse last night and it felt irregular.  (wife is a physical therapist) When I bend over, stand up - I can feel it.  Feels tired. Can walk across the yard and feel it.  Arranged for him to follow up with Rudi Cocoonna  Carroll, NP in AFib clinic tomorrow. Patient is agreeable to plan.

## 2014-03-13 ENCOUNTER — Ambulatory Visit (INDEPENDENT_AMBULATORY_CARE_PROVIDER_SITE_OTHER): Payer: BLUE CROSS/BLUE SHIELD | Admitting: Nurse Practitioner

## 2014-03-13 ENCOUNTER — Encounter: Payer: Self-pay | Admitting: Nurse Practitioner

## 2014-03-13 VITALS — BP 130/82 | HR 68 | Ht 73.0 in | Wt 210.0 lb

## 2014-03-13 DIAGNOSIS — R002 Palpitations: Secondary | ICD-10-CM

## 2014-03-13 DIAGNOSIS — I480 Paroxysmal atrial fibrillation: Secondary | ICD-10-CM

## 2014-03-13 NOTE — Progress Notes (Signed)
      Patient Care Team: Laurann Montanaynthia White, MD as PCP - General (Family Medicine)   HPI  Alejandro Hayes is a 44 y.o. male with h/o ventricular tacycardia /afib,underwent cardioversion 2014.  He takes daily metoprolol, has been maintaining SR and was asymptomatic for palpitations until a recent trip to [place]. He went for combined business/pleasure so there was some stress involved,usually avoids alcohol but did drink 3-4 alcoholic drinks a night and allso had less sleep than usual.He became aware of an irregular heart beat and fatigue but couldn't determine if it felt more like afib or v.tach. Yesterday he was still aware of just not feeling well, but now feels better. Ekg shows SR. He expresses desire to get back to his previous degree of fitness which included running but is concerned to do so. Exercise triggered his v.tach  Before.  Today, no complaints of chest pain, shortness of breath, PND, orthopnea, presyncopy/syncopy, palpitations.  Past Medical History  Diagnosis Date  . Genital herpes   . Lumbar herniated disc   . Allergic rhinitis   . Genital warts   . Spermatocele     right  . NSVT (nonsustained ventricular tachycardia)   . Atrial fibrillation 07/2012    Past Surgical History  Procedure Laterality Date  . Shoulder arthroscopy w/ superior labral anterior posterior lesion repair  2002    right  . Cardioversion N/A 08/18/2012    Procedure: CARDIOVERSION;  Surgeon: Duke SalviaSteven C Klein, MD;  [facility];  Service: Cardiovascular;  Laterality: N/A;    Current Outpatient Prescriptions  Medication Sig Dispense Refill  . metoprolol succinate (TOPROL-XL) 25 MG 24 hr tablet TAKE 1 AND 1/2 TABLETS BYMOUTH ONCE DAILY 45 tablet 5   No current facility-administered medications for this visit.    No Known Allergies  Review of Systems negative except from HPI and PMH  Physical Exam BP 130/82 mmHg  Pulse 68  Ht 6\' 1"  (1.854 m)  Wt 210 lb (95.255 kg)  BMI 27.71  kg/m2 Well developed and nourished in no acute distress HENT normal Neck supple with JVP-flat Clear Regular rate and rhythm, early systolic murmur  Abd-soft with active BS No Clubbing cyanosis edema Skin-warm and dry A & Oriented  Grossly normal sensory and motor function   EKG-Sinus rhythm at 67 bpm, minimal voltage for LVH   Assessment and  Plan   1. H/o nonsustained vtach/afib Continue metoprolol Echo  GXT Suggested to pt alivecore so he can monitor arrhythmias at home F/u with Dr. Graciela HusbandsKlein as scheduled end of March

## 2014-03-13 NOTE — Patient Instructions (Signed)
Your physician has requested that you have an exercise tolerance test. For further information please visit [email or web address]. Please also follow instruction sheet, as given.  Your physician has requested that you have an echocardiogram. Echocardiography is a painless test that uses sound waves to create images of your heart. It provides your doctor with information about the size and shape of your heart and how well your heart's chambers and valves are working. This procedure takes approximately one hour. There are no restrictions for this procedure.  Your physician recommends that you schedule a follow-up appointment with Dr. Graciela HusbandsKlein as already scheduled

## 2014-03-14 ENCOUNTER — Ambulatory Visit (INDEPENDENT_AMBULATORY_CARE_PROVIDER_SITE_OTHER): Payer: BLUE CROSS/BLUE SHIELD | Admitting: Physician Assistant

## 2014-03-14 ENCOUNTER — Ambulatory Visit (INDEPENDENT_AMBULATORY_CARE_PROVIDER_SITE_OTHER)
Admission: RE | Admit: 2014-03-14 | Discharge: 2014-03-14 | Disposition: A | Discharge status: Home / Self Care | Payer: Self-pay | Source: Ambulatory Visit | Attending: Physician Assistant | Admitting: Physician Assistant

## 2014-03-14 DIAGNOSIS — R9439 Abnormal result of other cardiovascular function study: Secondary | ICD-10-CM

## 2014-03-14 DIAGNOSIS — I480 Paroxysmal atrial fibrillation: Secondary | ICD-10-CM

## 2014-03-14 NOTE — Patient Instructions (Addendum)
PER SCOTT WEAVER, PAC YOU WILL NEED TO BE SCHEDULED FOR A CT CALCIUM SCORE; DX ABNORMAL STRESS TEST

## 2014-03-14 NOTE — Progress Notes (Signed)
Exercise Treadmill Test  Pre-Exercise Testing Evaluation Rhythm: normal sinus  Rate: 60 bpm     Test  Exercise Tolerance Test Ordering MD: Sherryl MangesSteven Klein, MD  Interpreting MD: Tereso NewcomerScott Gimena Buick, PA-C  Unique Test No: 1  Treadmill:  1  Indication for ETT: afib  Contraindication to ETT: No   Stress Modality: exercise - treadmill  Cardiac Imaging Performed: non   Protocol: standard Bruce - maximal  Max BP:  221/69  Max MPHR (bpm):  176 85% MPR (bpm):  150  MPHR obtained (bpm):  153 % MPHR obtained:  86  Reached 85% MPHR (min:sec):  10:57 Total Exercise Time (min-sec):  12:00  Workload in METS:  13.4 Borg Scale: 15  Reason ETT Terminated:  desired heart rate attained    ST Segment Analysis At Rest: normal ST segments - no evidence of significant ST depression With Exercise: borderline ST changes  Other Information Arrhythmia:  Yes Angina during ETT:  absent (0) Quality of ETT:  indeterminate  ETT Interpretation:  borderline (indeterminate) with non-specific ST changes  Comments: Excellent exercise capacity. No chest pain. Hypertensive BP response to exercise. There was borderline ST depression at peak exercise. Few runs of 3 beats of NSVT during recovery. No atrial fibrillation.  Recommendations: I reviewed his study today with Dr. Graciela HusbandsKlein. In review of his chart, he does not appear to have had a more significant ischemic evaluation in the past. Arrange coronary artery calcium score. If this is 0, no further workup. If this is elevated, proceed with Myoview. Signed,  Tereso NewcomerScott Ivis Nicolson, PA-C   03/14/2014 5:27 PM

## 2014-03-15 ENCOUNTER — Telehealth: Payer: Self-pay | Admitting: *Deleted

## 2014-03-15 NOTE — Telephone Encounter (Signed)
pt notified about CT calcium score is 0. No further cardiac test was needed at this time however; he does need to keep appt for echo 3/18. Pt said ok and thank you for the great news.

## 2014-03-21 ENCOUNTER — Other Ambulatory Visit (HOSPITAL_COMMUNITY): Payer: BLUE CROSS/BLUE SHIELD

## 2014-03-22 ENCOUNTER — Telehealth: Payer: Self-pay | Admitting: *Deleted

## 2014-03-22 ENCOUNTER — Ambulatory Visit (HOSPITAL_COMMUNITY): Payer: BLUE CROSS/BLUE SHIELD | Attending: Nurse Practitioner

## 2014-03-22 DIAGNOSIS — I480 Paroxysmal atrial fibrillation: Secondary | ICD-10-CM | POA: Insufficient documentation

## 2014-03-22 DIAGNOSIS — I4891 Unspecified atrial fibrillation: Secondary | ICD-10-CM | POA: Diagnosis not present

## 2014-03-22 NOTE — Telephone Encounter (Signed)
-----   Message from Newman Niponna C Carroll, NP sent at 03/22/2014  2:44 PM EDT ----- Please let pt know echo overall looks ok. No significant abnormalities.

## 2014-03-22 NOTE — Telephone Encounter (Signed)
Patient notified of echo results

## 2014-03-22 NOTE — Progress Notes (Signed)
2D Echo completed. 03/22/2014 

## 2014-04-02 ENCOUNTER — Ambulatory Visit (INDEPENDENT_AMBULATORY_CARE_PROVIDER_SITE_OTHER): Payer: BLUE CROSS/BLUE SHIELD | Admitting: Internal Medicine

## 2014-04-02 ENCOUNTER — Encounter: Payer: Self-pay | Admitting: Internal Medicine

## 2014-04-02 VITALS — BP 132/84 | HR 60 | Ht 73.0 in | Wt 206.4 lb

## 2014-04-02 DIAGNOSIS — I480 Paroxysmal atrial fibrillation: Secondary | ICD-10-CM

## 2014-04-02 DIAGNOSIS — I493 Ventricular premature depolarization: Secondary | ICD-10-CM

## 2014-04-02 NOTE — Progress Notes (Signed)
      Patient Care Team: Laurann Montanaynthia White, MD as PCP - General (Family Medicine)   HPI  Alejandro Hayes is a 44 y.o. male Seen in followup for ventricular ectopy it appeared to be left bundle inferior axis. ECG and echo were normal.SAECG niormal  He subsequently presented with atrial fibrillation and underwent cardioversion   He had been doing very well until he went to [place] where he drank a little bit too much and had more palpitations.  He has done well since he got home.  The spells were interesting in that they were frequent and provoked by exertion  He is continued low-dose beta blockers. He is tolerating without complication.  He tells me that his father died a year ago of esophageal cancer.  Past Medical History  Diagnosis Date  . Genital herpes   . Lumbar herniated disc   . Allergic rhinitis   . Genital warts   . Spermatocele     right  . NSVT (nonsustained ventricular tachycardia)   . Atrial fibrillation 07/2012    Past Surgical History  Procedure Laterality Date  . Shoulder arthroscopy w/ superior labral anterior posterior lesion repair  2002    right  . Cardioversion N/A 08/18/2012    Procedure: CARDIOVERSION;  Surgeon: Duke SalviaSteven C Norrin Shreffler, MD;  [facility];  Service: Cardiovascular;  Laterality: N/A;    Current Outpatient Prescriptions  Medication Sig Dispense Refill  . metoprolol succinate (TOPROL-XL) 25 MG 24 hr tablet TAKE 1 AND 1/2 TABLETS BYMOUTH ONCE DAILY 45 tablet 5   No current facility-administered medications for this visit.    No Known Allergies  Review of Systems negative except from HPI and PMH  Physical Exam BP 132/84 mmHg  Pulse 60  Ht 6\' 1"  (1.854 m)  Wt 206 lb 6.4 oz (93.622 kg)  BMI 27.24 kg/m2 Well developed and nourished in no acute distress HENT normal Neck supple with JVP-flat Clear Regular rate and rhythm, early systolic murmur  Abd-soft with active BS No Clubbing cyanosis edema Skin-warm and dry A & Oriented   Grossly normal sensory and motor function  .   ECG: Sinus Rhythm  @60             Intervals  16/09/39  Axis 66    Assessment and  Plan   PVCs Atrial fibrillation  . We'll continue him on metoprolol.   The spells that he had suggested may have been a more prolonged arrhythmia that he thought and I wonder whether the aggravation by exertion wasn't an increase in the ventricular response as opposed to the onset of the arrhythmia. We talked about the utility of [employer] monitor  We spent more than 50% of our >25 min visit in face to face counseling regarding the above

## 2014-04-02 NOTE — Patient Instructions (Signed)
Your physician recommends that you continue on your current medications as directed. Please refer to the Current Medication list given to you today.  Your physician wants you to follow-up in: 1 year with Dr. Klein.  You will receive a reminder letter in the mail two months in advance. If you don't receive a letter, please call our office to schedule the follow-up appointment.  

## 2014-05-28 ENCOUNTER — Other Ambulatory Visit: Payer: Self-pay | Admitting: *Deleted

## 2014-05-28 MED ORDER — METOPROLOL SUCCINATE ER 25 MG PO TB24
ORAL_TABLET | ORAL | Status: DC
Start: 2014-05-28 — End: 2015-04-18

## 2014-07-10 ENCOUNTER — Encounter: Payer: Self-pay | Admitting: Internal Medicine

## 2014-07-15 ENCOUNTER — Encounter: Payer: Self-pay | Admitting: *Deleted

## 2015-04-18 ENCOUNTER — Ambulatory Visit (INDEPENDENT_AMBULATORY_CARE_PROVIDER_SITE_OTHER): Payer: BLUE CROSS/BLUE SHIELD | Admitting: Internal Medicine

## 2015-04-18 ENCOUNTER — Encounter: Payer: Self-pay | Admitting: Internal Medicine

## 2015-04-18 VITALS — BP 122/70 | HR 56 | Ht 73.0 in | Wt 212.5 lb

## 2015-04-18 DIAGNOSIS — I493 Ventricular premature depolarization: Secondary | ICD-10-CM

## 2015-04-18 DIAGNOSIS — I480 Paroxysmal atrial fibrillation: Secondary | ICD-10-CM | POA: Diagnosis not present

## 2015-04-18 NOTE — Patient Instructions (Signed)

## 2015-04-18 NOTE — Progress Notes (Signed)
      Patient Care Team: Laurann Montanaynthia White, MD as PCP - General (Family Medicine)   HPI  Alejandro Hayes is a 45 y.o. male Seen in followup for ventricular ectopy it appeared to be left bundle inferior axis. ECG and echo were normal.SAECG niormal  He subsequently presented with atrial fibrillation and underwent cardioversion   He had been doing very well until he went to [place] where he drank a little bit too much and had more palpitations.     He has had no intercurrent afib  He is continued low-dose beta blockers. He is tolerating without complication.    Past Medical History  Diagnosis Date  . Genital herpes   . Lumbar herniated disc   . Allergic rhinitis   . Genital warts   . Spermatocele     right  . NSVT (nonsustained ventricular tachycardia)   . Atrial fibrillation 07/2012    Past Surgical History  Procedure Laterality Date  . Shoulder arthroscopy w/ superior labral anterior posterior lesion repair  2002    right  . Cardioversion N/A 08/18/2012    Procedure: CARDIOVERSION;  Surgeon: Duke SalviaSteven C Ikeem Cleckler, MD;  [facility];  Service: Cardiovascular;  Laterality: N/A;    Current Outpatient Prescriptions  Medication Sig Dispense Refill  . metoprolol succinate (TOPROL-XL) 25 MG 24 hr tablet Take one tablet (25 mg) by mouth once daily     No current facility-administered medications for this visit.    No Known Allergies  Review of Systems negative except from HPI and PMH  Physical Exam BP 122/70 mmHg  Ht 6\' 1"  (1.854 m)  Wt 212 lb 8 oz (96.389 kg)  BMI 28.04 kg/m2 Well developed and nourished in no acute distress HENT normal Neck supple with JVP-flat Clear Regular rate and rhythm, early systolic murmur  Abd-soft with active BS No Clubbing cyanosis edema Skin-warm and dry A & Oriented  Grossly normal sensory and motor function  .   ECG: Sinus Rhythm  @60             Intervals  16/09/39  Axis 66    Assessment and  Plan   PVCs Atrial  fibrillation  . We'll continue him on metoprolol.

## 2015-05-14 ENCOUNTER — Other Ambulatory Visit: Payer: Self-pay | Admitting: Family Medicine

## 2015-05-14 ENCOUNTER — Other Ambulatory Visit: Payer: BLUE CROSS/BLUE SHIELD

## 2015-05-14 DIAGNOSIS — M79661 Pain in right lower leg: Secondary | ICD-10-CM

## 2015-05-14 DIAGNOSIS — R609 Edema, unspecified: Secondary | ICD-10-CM

## 2015-05-15 ENCOUNTER — Ambulatory Visit
Admission: RE | Admit: 2015-05-15 | Discharge: 2015-05-15 | Disposition: A | Discharge status: Home / Self Care | Payer: BLUE CROSS/BLUE SHIELD | Source: Ambulatory Visit | Attending: Family Medicine | Admitting: Family Medicine

## 2015-05-15 DIAGNOSIS — R609 Edema, unspecified: Secondary | ICD-10-CM

## 2015-05-15 DIAGNOSIS — M79604 Pain in right leg: Secondary | ICD-10-CM | POA: Diagnosis not present

## 2015-05-15 DIAGNOSIS — M7989 Other specified soft tissue disorders: Secondary | ICD-10-CM | POA: Diagnosis not present

## 2015-05-15 DIAGNOSIS — M79661 Pain in right lower leg: Secondary | ICD-10-CM

## 2015-07-09 DIAGNOSIS — S83206A Unspecified tear of unspecified meniscus, current injury, right knee, initial encounter: Secondary | ICD-10-CM | POA: Diagnosis not present

## 2015-07-12 DIAGNOSIS — M25561 Pain in right knee: Secondary | ICD-10-CM | POA: Diagnosis not present

## 2015-07-18 DIAGNOSIS — S83206D Unspecified tear of unspecified meniscus, current injury, right knee, subsequent encounter: Secondary | ICD-10-CM | POA: Diagnosis not present

## 2015-07-22 ENCOUNTER — Telehealth: Payer: Self-pay | Admitting: Internal Medicine

## 2015-07-22 ENCOUNTER — Encounter: Payer: Self-pay | Admitting: Internal Medicine

## 2015-07-22 NOTE — Telephone Encounter (Signed)
Follow Up:    She wants to know if you received clearance she faxed over on 07-18-15. Please let her know asap. Pt is scheduled for surgery this Thursday.Please fax [phone].

## 2015-07-22 NOTE — Telephone Encounter (Signed)
Fax received. Pulled for MD to review.

## 2015-07-23 NOTE — Telephone Encounter (Signed)
Clearance letter dictated by Dr. Graciela HusbandsKlein- [facility] Nolon Nationsalldorf's office [phone]. Confirmation received.

## 2015-07-24 DIAGNOSIS — S83221A Peripheral tear of medial meniscus, current injury, right knee, initial encounter: Secondary | ICD-10-CM | POA: Diagnosis not present

## 2015-07-24 DIAGNOSIS — M2241 Chondromalacia patellae, right knee: Secondary | ICD-10-CM | POA: Diagnosis not present

## 2015-07-24 DIAGNOSIS — M94261 Chondromalacia, right knee: Secondary | ICD-10-CM | POA: Diagnosis not present

## 2015-07-24 DIAGNOSIS — S8331XA Tear of articular cartilage of right knee, current, initial encounter: Secondary | ICD-10-CM | POA: Diagnosis not present

## 2015-08-06 DIAGNOSIS — Z9889 Other specified postprocedural states: Secondary | ICD-10-CM | POA: Diagnosis not present

## 2015-09-05 DIAGNOSIS — Z125 Encounter for screening for malignant neoplasm of prostate: Secondary | ICD-10-CM | POA: Diagnosis not present

## 2015-09-05 DIAGNOSIS — Z0000 Encounter for general adult medical examination without abnormal findings: Secondary | ICD-10-CM | POA: Diagnosis not present

## 2015-09-05 DIAGNOSIS — Z13220 Encounter for screening for lipoid disorders: Secondary | ICD-10-CM | POA: Diagnosis not present

## 2015-10-08 DIAGNOSIS — Z23 Encounter for immunization: Secondary | ICD-10-CM | POA: Diagnosis not present

## 2015-10-13 ENCOUNTER — Emergency Department (HOSPITAL_COMMUNITY): Payer: BLUE CROSS/BLUE SHIELD

## 2015-10-13 ENCOUNTER — Encounter (HOSPITAL_COMMUNITY): Payer: Self-pay | Admitting: Emergency Medicine

## 2015-10-13 ENCOUNTER — Observation Stay (HOSPITAL_COMMUNITY)
Admission: EM | Admit: 2015-10-13 | Discharge: 2015-10-15 | Disposition: A | Discharge status: Home / Self Care | Payer: BLUE CROSS/BLUE SHIELD | Attending: Surgery | Admitting: Surgery

## 2015-10-13 DIAGNOSIS — Z8249 Family history of ischemic heart disease and other diseases of the circulatory system: Secondary | ICD-10-CM | POA: Insufficient documentation

## 2015-10-13 DIAGNOSIS — Z833 Family history of diabetes mellitus: Secondary | ICD-10-CM | POA: Insufficient documentation

## 2015-10-13 DIAGNOSIS — Z87891 Personal history of nicotine dependence: Secondary | ICD-10-CM | POA: Diagnosis not present

## 2015-10-13 DIAGNOSIS — I472 Ventricular tachycardia: Secondary | ICD-10-CM | POA: Insufficient documentation

## 2015-10-13 DIAGNOSIS — K3580 Unspecified acute appendicitis: Secondary | ICD-10-CM | POA: Diagnosis present

## 2015-10-13 DIAGNOSIS — Z8261 Family history of arthritis: Secondary | ICD-10-CM | POA: Diagnosis not present

## 2015-10-13 DIAGNOSIS — R1031 Right lower quadrant pain: Secondary | ICD-10-CM | POA: Diagnosis not present

## 2015-10-13 DIAGNOSIS — Z808 Family history of malignant neoplasm of other organs or systems: Secondary | ICD-10-CM | POA: Diagnosis not present

## 2015-10-13 DIAGNOSIS — I340 Nonrheumatic mitral (valve) insufficiency: Secondary | ICD-10-CM | POA: Diagnosis not present

## 2015-10-13 DIAGNOSIS — A6000 Herpesviral infection of urogenital system, unspecified: Secondary | ICD-10-CM | POA: Insufficient documentation

## 2015-10-13 DIAGNOSIS — K352 Acute appendicitis with generalized peritonitis: Secondary | ICD-10-CM | POA: Diagnosis not present

## 2015-10-13 DIAGNOSIS — I4891 Unspecified atrial fibrillation: Secondary | ICD-10-CM | POA: Insufficient documentation

## 2015-10-13 LAB — URINALYSIS, ROUTINE W REFLEX MICROSCOPIC
Bilirubin Urine: NEGATIVE
GLUCOSE, UA: NEGATIVE mg/dL
HGB URINE DIPSTICK: NEGATIVE
Ketones, ur: 15 mg/dL — AB
Leukocytes, UA: NEGATIVE
Nitrite: NEGATIVE
PROTEIN: NEGATIVE mg/dL
Specific Gravity, Urine: 1.01 (ref 1.005–1.030)
pH: 7 (ref 5.0–8.0)

## 2015-10-13 LAB — COMPREHENSIVE METABOLIC PANEL
ALK PHOS: 55 U/L (ref 38–126)
ALT: 28 U/L (ref 17–63)
AST: 26 U/L (ref 15–41)
Albumin: 4.5 g/dL (ref 3.5–5.0)
Anion gap: 8 (ref 5–15)
BUN: 12 mg/dL (ref 6–20)
CHLORIDE: 102 mmol/L (ref 101–111)
CO2: 25 mmol/L (ref 22–32)
CREATININE: 0.9 mg/dL (ref 0.61–1.24)
Calcium: 9.3 mg/dL (ref 8.9–10.3)
GFR calc Af Amer: >60 mL/min (ref >60)
GFR calc non Af Amer: >60 mL/min (ref >60)
Glucose, Bld: 110 mg/dL — ABNORMAL HIGH (ref 65–99)
Potassium: 3.9 mmol/L (ref 3.5–5.1)
SODIUM: 135 mmol/L (ref 135–145)
Total Bilirubin: 1.1 mg/dL (ref 0.3–1.2)
Total Protein: 7.6 g/dL (ref 6.5–8.1)

## 2015-10-13 LAB — CBC
HCT: 46.6 % (ref 39.0–52.0)
Hemoglobin: 16.1 g/dL (ref 13.0–17.0)
MCH: 30.8 pg (ref 26.0–34.0)
MCHC: 34.5 g/dL (ref 30.0–36.0)
MCV: 89.1 fL (ref 78.0–100.0)
PLATELETS: 174 10*3/uL (ref 150–400)
RBC: 5.23 MIL/uL (ref 4.22–5.81)
RDW: 12.6 % (ref 11.5–15.5)
WBC: 13.7 10*3/uL — ABNORMAL HIGH (ref 4.0–10.5)

## 2015-10-13 LAB — LIPASE, BLOOD: Lipase: 26 U/L (ref 11–51)

## 2015-10-13 MED ORDER — IOPAMIDOL (ISOVUE-300) INJECTION 61%
100.0000 mL | Freq: Once | INTRAVENOUS | Status: AC | PRN
  Administered 2015-10-13: 100 mL via INTRAVENOUS

## 2015-10-13 MED ORDER — SODIUM CHLORIDE 0.9 % IV BOLUS (SEPSIS)
1000.0000 mL | Freq: Once | INTRAVENOUS | Status: AC
  Administered 2015-10-13: 1000 mL via INTRAVENOUS

## 2015-10-13 NOTE — ED Provider Notes (Signed)
[facility]-EMERGENCY DEPT Provider Note   CSN: [id number] Arrival date & time: 10/13/15  1927     History   Chief Complaint Chief Complaint  Patient presents with  . Abdominal Pain    HPI Alejandro Hayes is a 45 y.o. male.  HPI   Patient is a 45 year old male with a history of A. fib s/p cardioversion in 2014, not Anticoagulated,  who presents the emergency department with abdominal pain since roughly 5 AM this morning. Patient said he woke with a constant generalized abdominal pain roughly 5/10, nonradiating. Pain is now roughly a 2/10 and in the right lower quadrant. Associated nausea and anorexia. No other associated symptoms. Patient denies fever, chills, vomiting, dysuria, hematuria, diarrhea. Patient was sent here by his primary care provider to rule out appendicitis.  Past Medical History:  Diagnosis Date  . Allergic rhinitis   . Atrial fibrillation (HCC) 07/2012  . Genital herpes   . Genital warts   . Lumbar herniated disc   . NSVT (nonsustained ventricular tachycardia) (HCC)   . Spermatocele    right    Patient Active Problem List   Diagnosis Date Noted  . Acute appendicitis 10/14/2015  . Atrial fibrillation (HCC) 07/20/2012  . Ventricular tachycardia, non-sustained (HCC) 03/22/2012  . Spells 02/07/2012  . Syncope 02/07/2012    Past Surgical History:  Procedure Laterality Date  . CARDIOVERSION N/A 08/18/2012   Procedure: CARDIOVERSION;  Surgeon: Duke SalviaSteven C Klein, MD;  [facility];  Service: Cardiovascular;  Laterality: N/A;  . SHOULDER ARTHROSCOPY W/ SUPERIOR LABRAL ANTERIOR POSTERIOR LESION REPAIR  2002   right       Home Medications    Prior to Admission medications   Medication Sig Start Date End Date Taking? Authorizing Provider  metoprolol succinate (TOPROL-XL) 25 MG 24 hr tablet Take one tablet (25 mg) by mouth once daily   Yes Historical Provider, MD    Family History Family History  Problem Relation Age of Onset  . Hypertension Father    . Stroke Father 2356    left optic nerve-lost visioin  . Arthritis Mother     rheumatoid  . Alzheimer's disease Paternal Grandfather [id number]  . Cancer Paternal Grandmother     cancer of the triceps muscle  . Alzheimer's disease Maternal Grandfather   . Diabetes Maternal Grandmother     Social History Social History  Substance Use Topics  . Smoking status: Former Games developermoker  . Smokeless tobacco: Not on file     Comment: quit 20 yrs ago  . Alcohol use 0.0 oz/week    1 - 2 Glasses of wine per week     Comment: occasional beer     Allergies   Review of patient's allergies indicates no known allergies.   Review of Systems Review of Systems   Physical Exam Updated Vital Signs BP 146/84 (BP Location: Left Arm)   Pulse 67   Temp 99.7 F (37.6 C) (Oral)   Resp 16   Ht 6\' 1"  (1.854 m)   Wt 88.3 kg   SpO2 98%   BMI 25.67 kg/m   Physical Exam  Constitutional: He appears well-developed and well-nourished. No distress.  HENT:  Head: Normocephalic and atraumatic.  Eyes: Conjunctivae are normal.  Cardiovascular: Normal rate, regular rhythm, normal heart sounds and intact distal pulses.  Exam reveals no gallop and no friction rub.   No murmur heard. Pulses:      Radial pulses are 2+ on the right side, and 2+ on the left side.  Dorsalis pedis pulses are 2+ on the right side, and 2+ on the left side.  Pulmonary/Chest: Effort normal and breath sounds normal. No respiratory distress. He has no wheezes. He has no rales.  Abdominal: Soft. Normal appearance and bowel sounds are normal. There is tenderness in the right lower quadrant. There is guarding and tenderness at McBurney's point. There is no rigidity, no rebound and negative Murphy's sign.  Musculoskeletal: Normal range of motion.  Neurological: He is alert. Coordination normal.  Skin: Skin is warm and dry. He is not diaphoretic.  Psychiatric: He has a normal mood and affect. His behavior is normal.  Nursing note and vitals  reviewed.    ED Treatments / Results  Labs (all labs ordered are listed, but only abnormal results are displayed) Labs Reviewed  COMPREHENSIVE METABOLIC PANEL - Abnormal; Notable for the following:       Result Value   Glucose, Bld 110 (*)    All other components within normal limits  CBC - Abnormal; Notable for the following:    WBC 13.7 (*)    All other components within normal limits  URINALYSIS, ROUTINE W REFLEX MICROSCOPIC (NOT AT [facility]) - Abnormal; Notable for the following:    Ketones, ur 15 (*)    All other components within normal limits  LIPASE, BLOOD    EKG  EKG Interpretation None       Radiology Ct Abdomen Pelvis W Contrast  Result Date: 10/14/2015 CLINICAL DATA:  Initial evaluation for acute right lower quadrant pain. Evaluate for appendicitis. EXAM: CT ABDOMEN AND PELVIS WITH CONTRAST TECHNIQUE: Multidetector CT imaging of the abdomen and pelvis was performed using the standard protocol following bolus administration of intravenous contrast. CONTRAST:  ISOVUE-300 IOPAMIDOL (ISOVUE-300) INJECTION 61% COMPARISON:  None available. FINDINGS: Lower chest: Mild subsegmental atelectasis seen dependently within the visualized lung bases. Visualized lungs are otherwise clear. No pleural or pericardial effusion. Hepatobiliary: The liver demonstrates a normal contrast enhanced appearance. Gallbladder within normal limits. No biliary dilatation. Pancreas: Pancreas within normal limits. Spleen: Few scattered subcentimeter hypodensities noted within the spleen, indeterminate, but of doubtful clinical significance. Spleen otherwise unremarkable. Adrenals/Urinary Tract: Adrenal glands are normal. Kidneys are equal size with symmetric enhancement. PE scattered subcentimeter hypodensities noted within the kidneys, too small the characterize, but statistically likely reflects small cyst. No nephrolithiasis, hydronephrosis, or focal enhancing renal mass. Ureters of normal caliber  without abnormality. Bladder partially distended and grossly unremarkable. Stomach/Bowel: Stomach within normal limits. No evidence for bowel obstruction. Appendix well visualized within the right lower quadrant, coursing just inferior to the cecum. Appendix is dilated up to 12 mm with associated circumferential be global enhancement and wall thickening with periappendiceal fat stranding. Findings consistent with acute appendicitis. No periappendiceal abscess. No evidence for perforation. No other acute inflammatory changes about the bowels. Vascular/Lymphatic: Normal intravascular enhancement seen throughout the intra-abdominal aorta and its branch vessels. Mild aorto bi-iliac atherosclerotic disease. No aneurysm. No adenopathy. Reproductive: Prostate normal. Other: No free air or fluid. Musculoskeletal: No acute osseous abnormality. No worrisome lytic or blastic osseous lesions. IMPRESSION: Findings consistent with acute appendicitis. No evidence for perforation or other complication. Electronically Signed   By: Rise Mu M.D.   On: 10/14/2015 00:06    Procedures Procedures (including critical care time)  Medications Ordered in ED Medications  0.9 %  sodium chloride infusion (not administered)  ondansetron (ZOFRAN) injection 4 mg (not administered)  cefTRIAXone (ROCEPHIN) 1 g in dextrose 5 % 50 mL IVPB (not administered)  metroNIDAZOLE (  FLAGYL) IVPB 500 mg (not administered)  sodium chloride 0.9 % bolus 1,000 mL (0 mLs Intravenous Stopped 10/14/15 0003)  iopamidol (ISOVUE-300) 61 % injection 100 mL (100 mLs Intravenous Contrast Given 10/13/15 2300)     Initial Impression / Assessment and Plan / ED Course  I have reviewed the triage vital signs and the nursing notes.  Pertinent labs & imaging results that were available during my care of the patient were reviewed by me and considered in my medical decision making (see chart for details).  Clinical Course   Patient with abdominal  pain concerning for appendicitis. Patient with leukocytosis of 13.7. CT scan reviewed by me revealed acute appendicitis with no evidence of perforation or other complication. Other labs unremarkable and VSS. Consulted general surgery and spoke with Dr. Gerrit Friends who will admit the patient. Patient was started on Rocephin and Flagyl in the ED and fluids.  Thank you Dr. Gerrit Friends for your consult, time and care of this patient.   Final Clinical Impressions(s) / ED Diagnoses   Final diagnoses:  Acute appendicitis, unspecified acute appendicitis type    New Prescriptions New Prescriptions   No medications on file     Jerre Simon, PA 10/14/15 0034    Mancel Bale, MD 10/14/15 1108

## 2015-10-13 NOTE — ED Triage Notes (Signed)
Pt states that he has had abdominal pain since 5am this morning. Denies N/V/D. Sent in from [place] walk [place] to R/O Appendicitis. Alert and oriented.

## 2015-10-13 NOTE — ED Notes (Signed)
Patient transported to CT 

## 2015-10-14 ENCOUNTER — Observation Stay (HOSPITAL_COMMUNITY): Payer: BLUE CROSS/BLUE SHIELD | Admitting: Certified Registered Nurse Anesthetist

## 2015-10-14 ENCOUNTER — Encounter (HOSPITAL_COMMUNITY): Admission: EM | Disposition: A | Discharge status: Home / Self Care | Payer: Self-pay | Source: Home / Self Care

## 2015-10-14 ENCOUNTER — Encounter (HOSPITAL_COMMUNITY): Payer: Self-pay | Admitting: Surgery

## 2015-10-14 DIAGNOSIS — I4891 Unspecified atrial fibrillation: Secondary | ICD-10-CM | POA: Diagnosis not present

## 2015-10-14 DIAGNOSIS — I472 Ventricular tachycardia: Secondary | ICD-10-CM | POA: Diagnosis not present

## 2015-10-14 DIAGNOSIS — K3580 Unspecified acute appendicitis: Secondary | ICD-10-CM | POA: Diagnosis present

## 2015-10-14 DIAGNOSIS — R1031 Right lower quadrant pain: Secondary | ICD-10-CM | POA: Diagnosis not present

## 2015-10-14 DIAGNOSIS — K353 Acute appendicitis with localized peritonitis: Secondary | ICD-10-CM | POA: Diagnosis not present

## 2015-10-14 HISTORY — PX: LAPAROSCOPIC APPENDECTOMY: SHX408

## 2015-10-14 LAB — SURGICAL PCR SCREEN
MRSA, PCR: NEGATIVE
Staphylococcus aureus: NEGATIVE

## 2015-10-14 SURGERY — APPENDECTOMY, LAPAROSCOPIC
Anesthesia: General

## 2015-10-14 MED ORDER — ONDANSETRON 4 MG PO TBDP: 4.0000 mg | ORAL_TABLET | Freq: Four times a day (QID) | ORAL | Status: DC | PRN

## 2015-10-14 MED ORDER — ROCURONIUM BROMIDE 100 MG/10ML IV SOLN
INTRAVENOUS | Status: DC | PRN
  Administered 2015-10-14: 50 mg via INTRAVENOUS

## 2015-10-14 MED ORDER — ACETAMINOPHEN 325 MG PO TABS
650.0000 mg | ORAL_TABLET | Freq: Four times a day (QID) | ORAL | Status: DC | PRN
  Administered 2015-10-14: 650 mg via ORAL
  Filled 2015-10-14: qty 2

## 2015-10-14 MED ORDER — SUGAMMADEX SODIUM 500 MG/5ML IV SOLN
INTRAVENOUS | Status: DC | PRN
  Administered 2015-10-14: 400 mg via INTRAVENOUS

## 2015-10-14 MED ORDER — BUPIVACAINE HCL 0.5 % IJ SOLN
INTRAMUSCULAR | Status: DC | PRN
  Administered 2015-10-14: 25 mL

## 2015-10-14 MED ORDER — ACETAMINOPHEN 10 MG/ML IV SOLN
INTRAVENOUS | Status: AC
  Filled 2015-10-14: qty 100

## 2015-10-14 MED ORDER — LIDOCAINE 2% (20 MG/ML) 5 ML SYRINGE
INTRAMUSCULAR | Status: AC
  Filled 2015-10-14: qty 5

## 2015-10-14 MED ORDER — ACETAMINOPHEN 10 MG/ML IV SOLN
INTRAVENOUS | Status: DC | PRN
  Administered 2015-10-14: 1000 mg via INTRAVENOUS

## 2015-10-14 MED ORDER — DEXAMETHASONE SODIUM PHOSPHATE 10 MG/ML IJ SOLN
INTRAMUSCULAR | Status: DC | PRN
  Administered 2015-10-14: 10 mg via INTRAVENOUS

## 2015-10-14 MED ORDER — LACTATED RINGERS IV SOLN
INTRAVENOUS | Status: DC
  Administered 2015-10-14 (×3): via INTRAVENOUS

## 2015-10-14 MED ORDER — METRONIDAZOLE IN NACL 5-0.79 MG/ML-% IV SOLN
500.0000 mg | Freq: Three times a day (TID) | INTRAVENOUS | Status: DC
  Administered 2015-10-14 – 2015-10-15 (×4): 500 mg via INTRAVENOUS
  Filled 2015-10-14 (×3): qty 100

## 2015-10-14 MED ORDER — ONDANSETRON HCL 4 MG/2ML IJ SOLN
INTRAMUSCULAR | Status: AC
  Filled 2015-10-14: qty 2

## 2015-10-14 MED ORDER — ONDANSETRON HCL 4 MG/2ML IJ SOLN
4.0000 mg | Freq: Four times a day (QID) | INTRAMUSCULAR | Status: DC | PRN
  Administered 2015-10-14 (×2): 2 mg via INTRAVENOUS

## 2015-10-14 MED ORDER — METRONIDAZOLE IN NACL 5-0.79 MG/ML-% IV SOLN
500.0000 mg | Freq: Once | INTRAVENOUS | Status: DC
  Filled 2015-10-14: qty 100

## 2015-10-14 MED ORDER — DEXTROSE 5 % IV SOLN
2.0000 g | INTRAVENOUS | Status: DC
  Administered 2015-10-14 – 2015-10-15 (×2): 2 g via INTRAVENOUS
  Filled 2015-10-14 (×3): qty 2

## 2015-10-14 MED ORDER — PROPOFOL 10 MG/ML IV BOLUS
INTRAVENOUS | Status: AC
  Filled 2015-10-14: qty 20

## 2015-10-14 MED ORDER — PROPOFOL 10 MG/ML IV BOLUS
INTRAVENOUS | Status: DC | PRN
  Administered 2015-10-14: 200 mg via INTRAVENOUS

## 2015-10-14 MED ORDER — ACETAMINOPHEN 650 MG RE SUPP: 650.0000 mg | Freq: Four times a day (QID) | RECTAL | Status: DC | PRN

## 2015-10-14 MED ORDER — BUPIVACAINE HCL (PF) 0.5 % IJ SOLN
INTRAMUSCULAR | Status: AC
  Filled 2015-10-14: qty 30

## 2015-10-14 MED ORDER — LIDOCAINE HCL (CARDIAC) 20 MG/ML IV SOLN
INTRAVENOUS | Status: DC | PRN
  Administered 2015-10-14: 100 mg via INTRAVENOUS

## 2015-10-14 MED ORDER — ONDANSETRON HCL 4 MG/2ML IJ SOLN: 4.0000 mg | Freq: Three times a day (TID) | INTRAMUSCULAR | Status: DC | PRN

## 2015-10-14 MED ORDER — SODIUM CHLORIDE 0.9 % IV SOLN
INTRAVENOUS | Status: AC
  Administered 2015-10-14: 01:00:00 via INTRAVENOUS

## 2015-10-14 MED ORDER — METOPROLOL TARTRATE 5 MG/5ML IV SOLN
INTRAVENOUS | Status: AC
  Filled 2015-10-14: qty 5

## 2015-10-14 MED ORDER — SUCCINYLCHOLINE CHLORIDE 20 MG/ML IJ SOLN
INTRAMUSCULAR | Status: AC
  Filled 2015-10-14: qty 1

## 2015-10-14 MED ORDER — ACETAMINOPHEN 325 MG PO TABS: 650.0000 mg | ORAL_TABLET | Freq: Four times a day (QID) | ORAL | Status: DC | PRN

## 2015-10-14 MED ORDER — HYDROMORPHONE HCL 1 MG/ML IJ SOLN: 0.2000 mg | INTRAMUSCULAR | Status: DC | PRN

## 2015-10-14 MED ORDER — DEXTROSE 5 % IV SOLN
1.0000 g | Freq: Once | INTRAVENOUS | Status: AC
  Filled 2015-10-14: qty 10

## 2015-10-14 MED ORDER — PROMETHAZINE HCL 25 MG/ML IJ SOLN: 6.2500 mg | INTRAMUSCULAR | Status: DC | PRN

## 2015-10-14 MED ORDER — OXYCODONE HCL 5 MG PO TABS
5.0000 mg | ORAL_TABLET | ORAL | Status: DC | PRN
  Administered 2015-10-14 – 2015-10-15 (×3): 5 mg via ORAL
  Filled 2015-10-14 (×3): qty 1

## 2015-10-14 MED ORDER — FENTANYL CITRATE (PF) 100 MCG/2ML IJ SOLN
INTRAMUSCULAR | Status: DC | PRN
  Administered 2015-10-14 (×2): 50 ug via INTRAVENOUS

## 2015-10-14 MED ORDER — FENTANYL CITRATE (PF) 100 MCG/2ML IJ SOLN
INTRAMUSCULAR | Status: AC
  Filled 2015-10-14: qty 2

## 2015-10-14 MED ORDER — SUGAMMADEX SODIUM 500 MG/5ML IV SOLN
INTRAVENOUS | Status: AC
  Filled 2015-10-14: qty 5

## 2015-10-14 MED ORDER — KCL IN DEXTROSE-NACL 20-5-0.45 MEQ/L-%-% IV SOLN
INTRAVENOUS | Status: DC
  Administered 2015-10-14: 02:00:00 via INTRAVENOUS
  Filled 2015-10-14 (×2): qty 1000

## 2015-10-14 MED ORDER — DEXTROSE 5 % IV SOLN
1.0000 g | Freq: Once | INTRAVENOUS | Status: AC
  Administered 2015-10-14: 1 g via INTRAVENOUS
  Filled 2015-10-14: qty 10

## 2015-10-14 MED ORDER — ROCURONIUM BROMIDE 10 MG/ML (PF) SYRINGE
PREFILLED_SYRINGE | INTRAVENOUS | Status: AC
  Filled 2015-10-14: qty 10

## 2015-10-14 MED ORDER — HYDROMORPHONE HCL 1 MG/ML IJ SOLN: 1.0000 mg | INTRAMUSCULAR | Status: DC | PRN

## 2015-10-14 MED ORDER — MIDAZOLAM HCL 5 MG/5ML IJ SOLN
INTRAMUSCULAR | Status: DC | PRN
  Administered 2015-10-14 (×2): 1 mg via INTRAVENOUS

## 2015-10-14 MED ORDER — DEXAMETHASONE SODIUM PHOSPHATE 10 MG/ML IJ SOLN
INTRAMUSCULAR | Status: AC
  Filled 2015-10-14: qty 1

## 2015-10-14 MED ORDER — MIDAZOLAM HCL 2 MG/2ML IJ SOLN
INTRAMUSCULAR | Status: AC
  Filled 2015-10-14: qty 2

## 2015-10-14 MED ORDER — HYDROCODONE-ACETAMINOPHEN 5-325 MG PO TABS: 1.0000 | ORAL_TABLET | ORAL | Status: DC | PRN

## 2015-10-14 MED ORDER — METOPROLOL TARTRATE 5 MG/5ML IV SOLN
INTRAVENOUS | Status: DC | PRN
Start: 2015-10-14 — End: 2015-10-14
  Administered 2015-10-14 (×2): 1 mg via INTRAVENOUS

## 2015-10-14 MED ORDER — HYDROMORPHONE HCL 1 MG/ML IJ SOLN
INTRAMUSCULAR | Status: AC
  Administered 2015-10-14: 0.5 mg via INTRAVENOUS
  Filled 2015-10-14: qty 1

## 2015-10-14 MED ORDER — HYDROMORPHONE HCL 1 MG/ML IJ SOLN
0.2500 mg | INTRAMUSCULAR | Status: DC | PRN
  Administered 2015-10-14: 0.25 mg via INTRAVENOUS
  Administered 2015-10-14: 0.5 mg via INTRAVENOUS
  Administered 2015-10-14: 0.25 mg via INTRAVENOUS
  Administered 2015-10-14: 0.5 mg via INTRAVENOUS

## 2015-10-14 SURGICAL SUPPLY — 52 items
ADH SKN CLS APL DERMABOND .7 (GAUZE/BANDAGES/DRESSINGS) ×1
APL SKNCLS STERI-STRIP NONHPOA (GAUZE/BANDAGES/DRESSINGS)
APPLIER CLIP 5 13 M/L LIGAMAX5 (MISCELLANEOUS)
APPLIER CLIP ROT 10 11.4 M/L (STAPLE)
APR CLP MED LRG 11.4X10 (STAPLE)
APR CLP MED LRG 5 ANG JAW (MISCELLANEOUS)
BAG SPEC RTRVL 10 TROC 200 (ENDOMECHANICALS)
BENZOIN TINCTURE PRP APPL 2/3 (GAUZE/BANDAGES/DRESSINGS) ×1 IMPLANT
CABLE HIGH FREQUENCY MONO STRZ (ELECTRODE) ×2 IMPLANT
CLIP APPLIE 5 13 M/L LIGAMAX5 (MISCELLANEOUS) IMPLANT
CLIP APPLIE ROT 10 11.4 M/L (STAPLE) IMPLANT
CLOSURE STERI-STRIP 1/4X4 (GAUZE/BANDAGES/DRESSINGS) ×1 IMPLANT
COVER SURGICAL LIGHT HANDLE (MISCELLANEOUS) ×1 IMPLANT
CUTTER FLEX LINEAR 45M (STAPLE) ×1 IMPLANT
DECANTER SPIKE VIAL GLASS SM (MISCELLANEOUS) ×1 IMPLANT
DERMABOND ADVANCED (GAUZE/BANDAGES/DRESSINGS) ×1
DERMABOND ADVANCED .7 DNX12 (GAUZE/BANDAGES/DRESSINGS) ×1 IMPLANT
DRAIN CHANNEL 19F RND (DRAIN) IMPLANT
DRAPE LAPAROSCOPIC ABDOMINAL (DRAPES) ×2 IMPLANT
ELECT REM PT RETURN 9FT ADLT (ELECTROSURGICAL) ×2
ELECTRODE REM PT RTRN 9FT ADLT (ELECTROSURGICAL) ×1 IMPLANT
ENDOLOOP SUT PDS II  0 18 (SUTURE)
ENDOLOOP SUT PDS II 0 18 (SUTURE) IMPLANT
EVACUATOR SILICONE 100CC (DRAIN) IMPLANT
GLOVE BIOGEL PI IND STRL 7.0 (GLOVE) ×1 IMPLANT
GLOVE BIOGEL PI INDICATOR 7.0 (GLOVE) ×1
GLOVE SURG SS PI 7.0 STRL IVOR (GLOVE) ×2 IMPLANT
GOWN STRL REUS W/TWL LRG LVL3 (GOWN DISPOSABLE) ×3 IMPLANT
GOWN STRL REUS W/TWL XL LVL3 (GOWN DISPOSABLE) ×1 IMPLANT
GRASPER SUT TROCAR 14GX15 (MISCELLANEOUS) ×1 IMPLANT
IRRIG SUCT STRYKERFLOW 2 WTIP (MISCELLANEOUS) ×2
IRRIGATION SUCT STRKRFLW 2 WTP (MISCELLANEOUS) ×1 IMPLANT
KIT BASIN OR (CUSTOM PROCEDURE TRAY) ×2 IMPLANT
NS IRRIG 1000ML POUR BTL (IV SOLUTION) ×1 IMPLANT
POUCH RETRIEVAL ECOSAC 10 (ENDOMECHANICALS) IMPLANT
POUCH RETRIEVAL ECOSAC 10MM (ENDOMECHANICALS)
RELOAD 45 VASCULAR/THIN (ENDOMECHANICALS) ×4 IMPLANT
RELOAD STAPLE 45 2.5 WHT GRN (ENDOMECHANICALS) IMPLANT
RELOAD STAPLE 45 3.5 BLU ETS (ENDOMECHANICALS) IMPLANT
RELOAD STAPLE TA45 3.5 REG BLU (ENDOMECHANICALS) ×2 IMPLANT
SCISSORS LAP 5X35 DISP (ENDOMECHANICALS) ×2 IMPLANT
SHEARS HARMONIC ACE PLUS 36CM (ENDOMECHANICALS) IMPLANT
SLEEVE XCEL OPT CAN 5 100 (ENDOMECHANICALS) ×2 IMPLANT
SUT ETHILON 2 0 PS N (SUTURE) IMPLANT
SUT MNCRL AB 4-0 PS2 18 (SUTURE) ×2 IMPLANT
TOWEL OR 17X26 10 PK STRL BLUE (TOWEL DISPOSABLE) ×2 IMPLANT
TOWEL OR NON WOVEN STRL DISP B (DISPOSABLE) ×1 IMPLANT
TRAY LAPAROSCOPIC (CUSTOM PROCEDURE TRAY) ×2 IMPLANT
TROCAR BLADELESS OPT 5 100 (ENDOMECHANICALS) ×2 IMPLANT
TROCAR XCEL 12X100 BLDLESS (ENDOMECHANICALS) IMPLANT
TROCAR XCEL BLUNT TIP 100MML (ENDOMECHANICALS) ×1 IMPLANT
TUBING INSUF HEATED (TUBING) ×2 IMPLANT

## 2015-10-14 NOTE — H&P (Signed)
Alejandro Hayes is an 45 y.o. male.    General Surgery [facility]  Chief Complaint: abdominal pain, acute appendicitis  HPI: patient is a 45 yo WM with less than 24 hour hx of abd pain localizing to the RLQ.  Patient denies nausea or emesis.  Ate soup for lunch.  No diarrhea.  Low grade fever, sweats.  Presented to Urgent Care and referred to ER for eval.  WBC 13K.  CTA positive acute appendicitis.  General surgery called for eval and management.  No prior abd surgery.  Orthopedic procedures by Dr. Novella Olive.  History of afib.  Cardioverted in 2014.  On beta-blocker and followed by Dr. Jolyn Nap.  Past Medical History:  Diagnosis Date  . Allergic rhinitis   . Atrial fibrillation ([place]) 07/2012  . Genital herpes   . Genital warts   . Lumbar herniated disc   . NSVT (nonsustained ventricular tachycardia) ([place])   . Spermatocele    right    Past Surgical History:  Procedure Laterality Date  . CARDIOVERSION N/A 08/18/2012   Procedure: CARDIOVERSION;  Surgeon: Deboraha Sprang, MD;  Location: [place];  Service: Cardiovascular;  Laterality: N/A;  . SHOULDER ARTHROSCOPY W/ SUPERIOR LABRAL ANTERIOR POSTERIOR LESION REPAIR  2002   right    Family History  Problem Relation Age of Onset  . Hypertension Father   . Stroke Father 57    left optic nerve-lost visioin  . Arthritis Mother     rheumatoid  . Alzheimer's disease Paternal Grandfather 37  . Cancer Paternal Grandmother     cancer of the triceps muscle  . Alzheimer's disease Maternal Grandfather   . Diabetes Maternal Grandmother    Social History:  reports that he has quit smoking. He does not have any smokeless tobacco history on file. He reports that he drinks alcohol. He reports that he does not use drugs.  Allergies: No Known Allergies   (Not in a hospital admission)  Results for orders placed or performed during the hospital encounter of 10/13/15 (from the past 48 hour(s))  Lipase, blood      Status: None   Collection Time: 10/13/15  7:48 PM  Result Value Ref Range   Lipase 26 11 - 51 U/L  Comprehensive metabolic panel     Status: Abnormal   Collection Time: 10/13/15  7:48 PM  Result Value Ref Range   Sodium 135 135 - 145 mmol/L   Potassium 3.9 3.5 - 5.1 mmol/L   Chloride 102 101 - 111 mmol/L   CO2 25 22 - 32 mmol/L   Glucose, Bld 110 (H) 65 - 99 mg/dL   BUN 12 6 - 20 mg/dL   Creatinine, Ser 0.90 0.61 - 1.24 mg/dL   Calcium 9.3 8.9 - 10.3 mg/dL   Total Protein 7.6 6.5 - 8.1 g/dL   Albumin 4.5 3.5 - 5.0 g/dL   AST 26 15 - 41 U/L   ALT 28 17 - 63 U/L   Alkaline Phosphatase 55 38 - 126 U/L   Total Bilirubin 1.1 0.3 - 1.2 mg/dL   GFR calc non Af Amer >60 >60 mL/min   GFR calc Af Amer >60 >60 mL/min    Comment: (NOTE) The eGFR has been calculated using the CKD EPI equation. This calculation has not been validated in all clinical situations. eGFR's persistently <60 mL/min signify possible Chronic Kidney Disease.    Anion gap 8 5 - 15  CBC     Status: Abnormal  Collection Time: 10/13/15  7:48 PM  Result Value Ref Range   WBC 13.7 (H) 4.0 - 10.5 K/uL   RBC 5.23 4.22 - 5.81 MIL/uL   Hemoglobin 16.1 13.0 - 17.0 g/dL   HCT 46.6 39.0 - 52.0 %   MCV 89.1 78.0 - 100.0 fL   MCH 30.8 26.0 - 34.0 pg   MCHC 34.5 30.0 - 36.0 g/dL   RDW 12.6 11.5 - 15.5 %   Platelets 174 150 - 400 K/uL  Urinalysis, Routine w reflex microscopic     Status: Abnormal   Collection Time: 10/13/15  8:39 PM  Result Value Ref Range   Color, Urine YELLOW YELLOW   APPearance CLEAR CLEAR   Specific Gravity, Urine 1.010 1.005 - 1.030   pH 7.0 5.0 - 8.0   Glucose, UA NEGATIVE NEGATIVE mg/dL   Hgb urine dipstick NEGATIVE NEGATIVE   Bilirubin Urine NEGATIVE NEGATIVE   Ketones, ur 15 (A) NEGATIVE mg/dL   Protein, ur NEGATIVE NEGATIVE mg/dL   Nitrite NEGATIVE NEGATIVE   Leukocytes, UA NEGATIVE NEGATIVE    Comment: MICROSCOPIC NOT DONE ON URINES WITH NEGATIVE PROTEIN, BLOOD, LEUKOCYTES, NITRITE, OR  GLUCOSE <1000 mg/dL.   Ct Abdomen Pelvis W Contrast  Result Date: 10/14/2015 CLINICAL DATA:  Initial evaluation for acute right lower quadrant pain. Evaluate for appendicitis. EXAM: CT ABDOMEN AND PELVIS WITH CONTRAST TECHNIQUE: Multidetector CT imaging of the abdomen and pelvis was performed using the standard protocol following bolus administration of intravenous contrast. CONTRAST:  197m ISOVUE-300 IOPAMIDOL (ISOVUE-300) INJECTION 61% COMPARISON:  None available. FINDINGS: Lower chest: Mild subsegmental atelectasis seen dependently within the visualized lung bases. Visualized lungs are otherwise clear. No pleural or pericardial effusion. Hepatobiliary: The liver demonstrates a normal contrast enhanced appearance. Gallbladder within normal limits. No biliary dilatation. Pancreas: Pancreas within normal limits. Spleen: Few scattered subcentimeter hypodensities noted within the spleen, indeterminate, but of doubtful clinical significance. Spleen otherwise unremarkable. Adrenals/Urinary Tract: Adrenal glands are normal. Kidneys are equal size with symmetric enhancement. PE scattered subcentimeter hypodensities noted within the kidneys, too small the characterize, but statistically likely reflects small cyst. No nephrolithiasis, hydronephrosis, or focal enhancing renal mass. Ureters of normal caliber without abnormality. Bladder partially distended and grossly unremarkable. Stomach/Bowel: Stomach within normal limits. No evidence for bowel obstruction. Appendix well visualized within the right lower quadrant, coursing just inferior to the cecum. Appendix is dilated up to 12 mm with associated circumferential be global enhancement and wall thickening with periappendiceal fat stranding. Findings consistent with acute appendicitis. No periappendiceal abscess. No evidence for perforation. No other acute inflammatory changes about the bowels. Vascular/Lymphatic: Normal intravascular enhancement seen throughout the  intra-abdominal aorta and its branch vessels. Mild aorto bi-iliac atherosclerotic disease. No aneurysm. No adenopathy. Reproductive: Prostate normal. Other: No free air or fluid. Musculoskeletal: No acute osseous abnormality. No worrisome lytic or blastic osseous lesions. IMPRESSION: Findings consistent with acute appendicitis. No evidence for perforation or other complication. Electronically Signed   By: BJeannine BogaM.D.   On: 10/14/2015 00:06    Review of Systems  Constitutional: Positive for chills, diaphoresis and fever.  HENT: Negative.   Eyes: Negative.   Respiratory: Negative.   Cardiovascular: Negative.   Gastrointestinal: Positive for abdominal pain. Negative for constipation, diarrhea, nausea and vomiting.  Genitourinary: Negative.   Musculoskeletal: Negative.   Skin: Negative.   Neurological: Negative.   Endo/Heme/Allergies: Negative.   Psychiatric/Behavioral: Negative.     Blood pressure 139/78, pulse 72, temperature 99.7 F (37.6 C), temperature source Oral, resp. rate  16, height _0  (1.854 m), weight 88.3 kg (194 lb 9 oz), SpO2 98 %. Physical Exam  Constitutional: He is oriented to person, place, and time. He appears well-developed and well-nourished. No distress.  HENT:  Head: Normocephalic and atraumatic.  Right Ear: External ear normal.  Left Ear: External ear normal.  Eyes: Conjunctivae are normal. Pupils are equal, round, and reactive to light. No scleral icterus.  Neck: Normal range of motion. Neck supple. No tracheal deviation present. No thyromegaly present.  Cardiovascular: Normal rate, regular rhythm and normal heart sounds.   No murmur heard. Respiratory: Effort normal and breath sounds normal. No respiratory distress. He has no wheezes.  GI: Soft. Bowel sounds are normal. He exhibits no distension and no mass. There is tenderness (RLQ). There is guarding. There is no rebound.  Musculoskeletal: Normal range of motion. He exhibits no edema or  deformity.  Neurological: He is alert and oriented to person, place, and time.  Skin: Skin is warm and dry. He is not diaphoretic.  Psychiatric: He has a normal mood and affect. His behavior is normal.     Assessment/Plan Acute appendicitis  Admit to general surgery service  Start IV abx  NPO  Plan OR in AM - lap appendectomy  The risks and benefits of the procedure have been discussed at length with the patient.  The patient understands the proposed procedure, potential alternative treatments, and the course of recovery to be expected.  All of the patient's questions have been answered at this time.  The patient wishes to proceed with surgery.  Earnstine Regal, MD, [facility] Office: [place], MD 10/14/2015, 1:19 AM

## 2015-10-14 NOTE — Anesthesia Preprocedure Evaluation (Addendum)
Anesthesia Evaluation    Airway Mallampati: II  TM Distance: >3 FB Neck ROM: Full    Dental no notable dental hx.    Pulmonary former smoker,    Pulmonary exam normal breath sounds clear to auscultation       Cardiovascular Normal cardiovascular exam Rhythm:Regular Rate:Normal  H/O non sustained vtach H/O afib On metoprolol  ECHO: 03-22-15  Study Conclusions  - Left ventricle: The cavity size was normal. Wall thickness was increased in a pattern of mild LVH. Systolic function was normal. The estimated ejection fraction was in the range of 60% to 65%. Wall motion was normal; there were no regional wall motion abnormalities. Left ventricular diastolic function parameters were normal. - Mitral valve: There was mild regurgitation. - Left atrium: The atrium was mildly dilated. - Right atrium: The atrium was mildly dilated.   Neuro/Psych    GI/Hepatic   Endo/Other    Renal/GU      Musculoskeletal   Abdominal   Peds  Hematology   Anesthesia Other Findings   Reproductive/Obstetrics                            Anesthesia Physical Anesthesia Plan  ASA: III  Anesthesia Plan: General   Post-op Pain Management:    Induction: Intravenous  Airway Management Planned: Oral ETT  Additional Equipment:   Intra-op Plan:   Post-operative Plan: Extubation in OR  Informed Consent: I have reviewed the patients History and Physical, chart, labs and discussed the procedure including the risks, benefits and alternatives for the proposed anesthesia with the patient or authorized representative who has indicated his/her understanding and acceptance.   Dental advisory given  Plan Discussed with: CRNA  Anesthesia Plan Comments:        Anesthesia Quick Evaluation

## 2015-10-14 NOTE — Transfer of Care (Signed)
Immediate Anesthesia Transfer of Care Note  Patient: Lolita LenzRaymond C Tumminello  Procedure(s) Performed: Procedure(s): APPENDECTOMY LAPAROSCOPIC (N/A)  Patient Location: PACU  Anesthesia Type:General  Level of Consciousness: awake, oriented, patient cooperative, lethargic and responds to stimulation  Airway & Oxygen Therapy: Patient Spontanous Breathing and Patient connected to face mask oxygen  Post-op Assessment: Report given to RN, Post -op Vital signs reviewed and stable and Patient moving all extremities  Post vital signs: Reviewed and stable  Last Vitals:  Vitals:   10/14/15 0523 10/14/15 0859  BP: 95/73 132/74  Pulse: 71 65  Resp: 16 16  Temp: 37.2 C 37 C    Last Pain:  Vitals:   10/14/15 0859  TempSrc: Oral  PainSc:       Patients Stated Pain Goal: 3 (10/14/15 0800)  Complications: No apparent anesthesia complications

## 2015-10-14 NOTE — Discharge Instructions (Signed)

## 2015-10-14 NOTE — Anesthesia Postprocedure Evaluation (Signed)
Anesthesia Post Note  Patient: Alejandro LenzRaymond C Sowder  Procedure(s) Performed: Procedure(s) (LRB): APPENDECTOMY LAPAROSCOPIC (N/A)  Patient location during evaluation: PACU Anesthesia Type: General Level of consciousness: awake and alert Pain management: pain level controlled Vital Signs Assessment: post-procedure vital signs reviewed and stable Respiratory status: spontaneous breathing, nonlabored ventilation, respiratory function stable and patient connected to nasal cannula oxygen Cardiovascular status: blood pressure returned to baseline and stable Postop Assessment: no signs of nausea or vomiting Anesthetic complications: no    Last Vitals:  Vitals:   10/14/15 1444 10/14/15 1549  BP: 135/70 (!) 142/77  Pulse: 61 61  Resp: 15 14  Temp: 36.8 C 36.7 C    Last Pain:  Vitals:   10/14/15 1549  TempSrc: Oral  PainSc:                  Justis Closser J

## 2015-10-14 NOTE — Anesthesia Procedure Notes (Signed)
Procedure Name: Intubation Date/Time: 10/14/2015 12:32 PM Performed by: Sherrian DiversENENNY, BRUCE Pre-anesthesia Checklist: Patient identified, Timeout performed, Emergency Drugs available, Suction available and Patient being monitored Patient Re-evaluated:Patient Re-evaluated prior to inductionOxygen Delivery Method: Circle system utilized Preoxygenation: Pre-oxygenation with 100% oxygen Intubation Type: IV induction and Cricoid Pressure applied Ventilation: Mask ventilation without difficulty Laryngoscope Size: Mac and 4 Grade View: Grade II Tube type: Oral Tube size: 7.5 mm Number of attempts: 1 Airway Equipment and Method: Stylet Placement Confirmation: ETT inserted through vocal cords under direct vision,  positive ETCO2 and breath sounds checked- equal and bilateral Secured at: 23 cm Tube secured with: Tape Dental Injury: Teeth and Oropharynx as per pre-operative assessment  Comments: Anterior.  Cords visualized with head lift/cricoid pressure with mac 4.  Had to advance ett to 23 for seal.

## 2015-10-14 NOTE — Op Note (Signed)
Operative Note  Lolita LenzRaymond C Swindler 45 y.o. male [id number]  10/14/2015  Surgeon: Berna Buehelsea A Kirt Chew   Assistant: none  Procedure performed: Laparoscopic Appendectomy  Preop diagnosis: acute appendicitis, non-perforated   Post-op diagnosis/intraop findings: same  Specimens: appendix  EBL: 5cc  Complications: none  Description of procedure: After obtaining informed consent the patient was brought to the operating room. Prophylactic antibiotics and subcutaneous heparin were administered. SCD's were applied. General endotracheal anesthesia was initiated and a formal time-out was performed. The abdomen was prepped and draped in the usual sterile fashion and the abdomen was entered using a veress at the umbilicus after instilling the site with local. Insufflation to 15mmHg was obtained and a 5mm trocar was introduced. Gross inspection revealed no evidence of injury from our entry or other intraabdominal abnormalities. A suprapubic 5mm trocars was placed along with a left lower quadrant 12mm trocar under direct visualization following infiltration with local. The patient was placed in trendelenburg and the small bowel reflected cephalad. The appendix was visualized in the right lower quadrant and was acutely inflamed but without any perforation or local ascites. There were some anatomical adhesions of the terminal ileum to the lateral abdominal wall, these were minimally interrupted in order to fully expose the appendix and its base. The cecum was noted to be partially retroperitoneal. The appendix was grasped and retracted entered to expose the base, and a window was created in the appendiceal mesentery using a [place] dissector. The base of the appendix was transected with a blue load linear cutting stapler. The mesial appendix was transected with a white load linear cutting stapler. A small amount of bleeding on the mesenteric staple line was controlled with cautery. Our staple lines were then  reinspected and found to be well opposed and hemostatic. The appendix was placed in an Endo Catch bag and removed through our left lower quadrant incision. The right lower quadrant was then irrigated and closely inspected for hemostasis. The terminal ileum and cecum were visualized and noted to be unharmed. The ileal sail was allowed to fall over the staple line. We then removed our 12 mm trocar and the fascia here was closed with a 0 Vicryl using a PMI device. The remaining trochars were then removed and the abdomen was desufflated. The skin incisions were closed with running subcuticular monocryl and Dermabond. The patient was awakened, extubated and transported to the recovery room in stable condition.   All counts were correct at the completion of the case.

## 2015-10-14 NOTE — Progress Notes (Signed)
I have met with Alejandro Hayes and we went over once more the nature of appendicitis, the surgical plan, and risks/benefits/alternatives. He is agreeable to proceed. Possible discharge this evening vs tomorrow depending on how he feels post-op.

## 2015-10-15 MED ORDER — OXYCODONE HCL 5 MG PO TABS
5.0000 mg | ORAL_TABLET | ORAL | 0 refills | Status: DC | PRN
Start: 2015-10-15 — End: 2016-05-01

## 2015-10-15 NOTE — Progress Notes (Signed)
Patient ID: Alejandro Hayes, male   DOB: 07/30/1970, 45 y.o.   MRN: [id number]  [facility]  1 Day Post-Op  Subjective: Patient walking laps this morning. Feeling well, only complaint is pain in his shoulder. Tolerating diet.  Objective: Vital signs in last 24 hours: Temp:  [97.7 F (36.5 C)-98.6 F (37 C)] 98.1 F (36.7 C) (10/11 0518) Pulse Rate:  [53-79] 72 (10/11 0518) Resp:  [13-16] 16 (10/11 0518) BP: (131-156)/(70-90) 131/70 (10/11 0518) SpO2:  [98 %-100 %] 98 % (10/11 0518) Last BM Date: 10/13/15  Intake/Output from previous day: 10/10 0701 - 10/11 0700 In: 3040 [P.O.:1440; I.V.:1200; IV Piggyback:400] Out: 500 [Urine:300] Intake/Output this shift: No intake/output data recorded.  PE: Gen:  Alert, NAD, pleasant Card:  RRR Abd: Soft, ND, appropriately tender, +BS, incisions C/D/I  Lab Results:   Recent Labs  10/13/15 1948  WBC 13.7*  HGB 16.1  HCT 46.6  PLT 174   BMET  Recent Labs  10/13/15 1948  NA 135  K 3.9  CL 102  CO2 25  GLUCOSE 110*  BUN 12  CREATININE 0.90  CALCIUM 9.3   PT/INR No results for input(s): LABPROT, INR in the last 72 hours. CMP     Component Value Date/Time   NA 135 10/13/2015 1948   K 3.9 10/13/2015 1948   CL 102 10/13/2015 1948   CO2 25 10/13/2015 1948   GLUCOSE 110 (H) 10/13/2015 1948   BUN 12 10/13/2015 1948   CREATININE 0.90 10/13/2015 1948   CALCIUM 9.3 10/13/2015 1948   PROT 7.6 10/13/2015 1948   ALBUMIN 4.5 10/13/2015 1948   AST 26 10/13/2015 1948   ALT 28 10/13/2015 1948   ALKPHOS 55 10/13/2015 1948   BILITOT 1.1 10/13/2015 1948   GFRNONAA >60 10/13/2015 1948   GFRAA >60 10/13/2015 1948   Lipase     Component Value Date/Time   LIPASE 26 10/13/2015 1948       Studies/Results: Ct Abdomen Pelvis W Contrast  Result Date: 10/14/2015 CLINICAL DATA:  Initial evaluation for acute right lower quadrant pain. Evaluate for appendicitis. EXAM: CT ABDOMEN AND PELVIS WITH  CONTRAST TECHNIQUE: Multidetector CT imaging of the abdomen and pelvis was performed using the standard protocol following bolus administration of intravenous contrast. CONTRAST:  100mL ISOVUE-300 IOPAMIDOL (ISOVUE-300) INJECTION 61% COMPARISON:  None available. FINDINGS: Lower chest: Mild subsegmental atelectasis seen dependently within the visualized lung bases. Visualized lungs are otherwise clear. No pleural or pericardial effusion. Hepatobiliary: The liver demonstrates a normal contrast enhanced appearance. Gallbladder within normal limits. No biliary dilatation. Pancreas: Pancreas within normal limits. Spleen: Few scattered subcentimeter hypodensities noted within the spleen, indeterminate, but of doubtful clinical significance. Spleen otherwise unremarkable. Adrenals/Urinary Tract: Adrenal glands are normal. Kidneys are equal size with symmetric enhancement. PE scattered subcentimeter hypodensities noted within the kidneys, too small the characterize, but statistically likely reflects small cyst. No nephrolithiasis, hydronephrosis, or focal enhancing renal mass. Ureters of normal caliber without abnormality. Bladder partially distended and grossly unremarkable. Stomach/Bowel: Stomach within normal limits. No evidence for bowel obstruction. Appendix well visualized within the right lower quadrant, coursing just inferior to the cecum. Appendix is dilated up to 12 mm with associated circumferential be global enhancement and wall thickening with periappendiceal fat stranding. Findings consistent with acute appendicitis. No periappendiceal abscess. No evidence for perforation. No other acute inflammatory changes about the bowels. Vascular/Lymphatic: Normal intravascular enhancement seen throughout the intra-abdominal aorta and its branch vessels. Mild aorto bi-iliac atherosclerotic disease. No aneurysm.  No adenopathy. Reproductive: Prostate normal. Other: No free air or fluid. Musculoskeletal: No acute osseous  abnormality. No worrisome lytic or blastic osseous lesions. IMPRESSION: Findings consistent with acute appendicitis. No evidence for perforation or other complication. Electronically Signed   By: Rise Mu M.D.   On: 10/14/2015 00:06    Anti-infectives: Anti-infectives    Start     Dose/Rate Route Frequency Ordered Stop   10/14/15 2359  cefTRIAXone (ROCEPHIN) 2 g in dextrose 5 % 50 mL IVPB     2 g 100 mL/hr over 30 Minutes Intravenous Every 24 hours 10/14/15 0131     10/14/15 0200  metroNIDAZOLE (FLAGYL) IVPB 500 mg     500 mg 100 mL/hr over 60 Minutes Intravenous Every 8 hours 10/14/15 0131     10/14/15 0200  cefTRIAXone (ROCEPHIN) 1 g in dextrose 5 % 50 mL IVPB     1 g 100 mL/hr over 30 Minutes Intravenous  Once 10/14/15 0158 10/14/15 0230   10/14/15 0045  cefTRIAXone (ROCEPHIN) 1 g in dextrose 5 % 50 mL IVPB     1 g 100 mL/hr over 30 Minutes Intravenous  Once 10/14/15 0031 10/14/15 0121   10/14/15 0045  metroNIDAZOLE (FLAGYL) IVPB 500 mg  Status:  Discontinued     500 mg 100 mL/hr over 60 Minutes Intravenous  Once 10/14/15 0031 10/14/15 0309       Assessment/Plan S/p Laparoscopic Appendectomy 10/14/15 Dr.Connor - POD 1 - pain controlled, tolerating diet  Afib - cardioverted in 2014, on beta blocker, followed by Dr. Berton Mount  ID - flagyl/rocephin day 2 VTE - SCDs FEN - regular  Plan - ready for discharge later this morning. Patient will follow-up with Dr. Fredricka Bonine in about 3 weeks.   LOS: 1 day    Edson Snowball , [facility] 10/15/2015, 7:39 AM Pager: [phone] Consults: [phone] Mon-Fri 7:00 am-4:30 pm Sat-Sun 7:00 am-11:30 am

## 2015-10-15 NOTE — Discharge Summary (Signed)
[facility] Discharge Summary   Patient ID: Alejandro Hayes MRN: [id number] DOB/AGE: [birth year] 44 y.o.  Admit date: 10/13/2015 Discharge date: 10/15/2015  Admitting Diagnosis: Acute appendicitis  Discharge Diagnosis Patient Active Problem List   Diagnosis Date Noted  . Acute appendicitis 10/14/2015  . Atrial fibrillation (HCC) 07/20/2012  . Ventricular tachycardia, non-sustained (HCC) 03/22/2012  . Spells 02/07/2012  . Syncope 02/07/2012    Consultants None  Imaging: Ct Abdomen Pelvis W Contrast  Result Date: 10/14/2015 CLINICAL DATA:  Initial evaluation for acute right lower quadrant pain. Evaluate for appendicitis. EXAM: CT ABDOMEN AND PELVIS WITH CONTRAST TECHNIQUE: Multidetector CT imaging of the abdomen and pelvis was performed using the standard protocol following bolus administration of intravenous contrast. CONTRAST:  ISOVUE-300 IOPAMIDOL (ISOVUE-300) INJECTION 61% COMPARISON:  None available. FINDINGS: Lower chest: Mild subsegmental atelectasis seen dependently within the visualized lung bases. Visualized lungs are otherwise clear. No pleural or pericardial effusion. Hepatobiliary: The liver demonstrates a normal contrast enhanced appearance. Gallbladder within normal limits. No biliary dilatation. Pancreas: Pancreas within normal limits. Spleen: Few scattered subcentimeter hypodensities noted within the spleen, indeterminate, but of doubtful clinical significance. Spleen otherwise unremarkable. Adrenals/Urinary Tract: Adrenal glands are normal. Kidneys are equal size with symmetric enhancement. PE scattered subcentimeter hypodensities noted within the kidneys, too small the characterize, but statistically likely reflects small cyst. No nephrolithiasis, hydronephrosis, or focal enhancing renal mass. Ureters of normal caliber without abnormality. Bladder partially distended and grossly unremarkable. Stomach/Bowel: Stomach within normal limits. No evidence for  bowel obstruction. Appendix well visualized within the right lower quadrant, coursing just inferior to the cecum. Appendix is dilated up to 12 mm with associated circumferential be global enhancement and wall thickening with periappendiceal fat stranding. Findings consistent with acute appendicitis. No periappendiceal abscess. No evidence for perforation. No other acute inflammatory changes about the bowels. Vascular/Lymphatic: Normal intravascular enhancement seen throughout the intra-abdominal aorta and its branch vessels. Mild aorto bi-iliac atherosclerotic disease. No aneurysm. No adenopathy. Reproductive: Prostate normal. Other: No free air or fluid. Musculoskeletal: No acute osseous abnormality. No worrisome lytic or blastic osseous lesions. IMPRESSION: Findings consistent with acute appendicitis. No evidence for perforation or other complication. Electronically Signed   By: Rise Mu M.D.   On: 10/14/2015 00:06    Procedures Dr. Fredricka Bonine (10/14/15) - Laparoscopic Appendectomy  Hospital Course:  Alejandro Hayes is a 44yo male who presented to [facility] early on 10/14/15 with 24 hours of RLQ abdominal pain.  Workup showed acute appendicitis.  Patient was admitted and underwent procedure listed above.  Tolerated procedure well and was transferred to the floor.  Diet was advanced as tolerated.  On POD1 the patient was voiding well, tolerating diet, ambulating well, pain well controlled, vital signs stable, incisions c/d/i and felt stable for discharge home.  Patient will follow up in our office in 3 weeks and knows to call with questions or concerns.   Physical Exam: Gen:  Alert, NAD, pleasant Card:  RRR Abd: Soft, ND, appropriately tender, +BS, incisions C/D/I    Medication List    TAKE these medications   metoprolol succinate 25 MG 24 hr tablet Commonly known as:  TOPROL-XL 12.5 mg. Take one tablet (25 mg) by mouth once daily   oxyCODONE 5 MG immediate release tablet Commonly known  as:  Oxy IR/ROXICODONE Take 1 tablet (5 mg total) by mouth every 4 (four) hours as needed for severe pain.        Follow-up Information    Berna Bue, MD.  Call today.   Specialty:  General Surgery Why:  Please call as soon as you leave the hospital to make a follow-up appointment with Dr. Fredricka Bonineonnor in about 3 weeks. Contact information: [phone]           Signed: Edson SnowballBROOKE A MILLER, [facility] 10/15/2015, 12:18 PM Pager: [phone] Consults: [phone] Mon-Fri 7:00 am-4:30 pm Sat-Sun 7:00 am-11:30 am

## 2015-10-15 NOTE — Progress Notes (Signed)
Discharge instructions discussed with patient, verbalized understanding and agreement.  Prescription given to patient

## 2015-10-21 ENCOUNTER — Other Ambulatory Visit: Payer: Self-pay | Admitting: Internal Medicine

## 2015-10-22 ENCOUNTER — Other Ambulatory Visit: Payer: Self-pay | Admitting: *Deleted

## 2015-10-22 NOTE — Telephone Encounter (Signed)
Please clarify which dosage patient should be taking so that it can be refilled

## 2015-10-23 NOTE — Telephone Encounter (Signed)
It looks like he was taking the full tablet when we saw him in April. Please call the patient to verify what he is taking and then ok to fill.   Thanks!

## 2015-10-24 ENCOUNTER — Telehealth: Payer: Self-pay | Admitting: Internal Medicine

## 2015-10-24 ENCOUNTER — Other Ambulatory Visit: Payer: Self-pay | Admitting: *Deleted

## 2015-10-24 ENCOUNTER — Telehealth: Payer: Self-pay | Admitting: *Deleted

## 2015-10-24 MED ORDER — METOPROLOL SUCCINATE ER 25 MG PO TB24: ORAL_TABLET | ORAL | 3 refills | Status: DC

## 2015-10-24 NOTE — Telephone Encounter (Signed)
Called patient to verify the dosage of Metoprolol that he takes. He stated that he takes 1/2 pill daily and was told by Dr. Graciela HusbandsKlein that he could go up to 1 whole pill daily if he needed to.

## 2015-10-24 NOTE — Telephone Encounter (Signed)
[facility] ( [facility] is calling because the patient has already picked up the Medication Metoprolol XL. Any questions please call .Marland Kitchen. Thanks

## 2015-10-29 DIAGNOSIS — D2271 Melanocytic nevi of right lower limb, including hip: Secondary | ICD-10-CM | POA: Diagnosis not present

## 2015-10-29 DIAGNOSIS — L821 Other seborrheic keratosis: Secondary | ICD-10-CM | POA: Diagnosis not present

## 2015-10-29 DIAGNOSIS — D2261 Melanocytic nevi of right upper limb, including shoulder: Secondary | ICD-10-CM | POA: Diagnosis not present

## 2015-10-29 DIAGNOSIS — A630 Anogenital (venereal) warts: Secondary | ICD-10-CM | POA: Diagnosis not present

## 2015-10-29 DIAGNOSIS — D225 Melanocytic nevi of trunk: Secondary | ICD-10-CM | POA: Diagnosis not present

## 2015-12-22 DIAGNOSIS — L300 Nummular dermatitis: Secondary | ICD-10-CM | POA: Diagnosis not present

## 2016-02-23 DIAGNOSIS — H52221 Regular astigmatism, right eye: Secondary | ICD-10-CM | POA: Diagnosis not present

## 2016-05-01 ENCOUNTER — Encounter: Payer: Self-pay | Admitting: Family Medicine

## 2016-05-01 ENCOUNTER — Ambulatory Visit (INDEPENDENT_AMBULATORY_CARE_PROVIDER_SITE_OTHER): Payer: BLUE CROSS/BLUE SHIELD

## 2016-05-01 ENCOUNTER — Ambulatory Visit (INDEPENDENT_AMBULATORY_CARE_PROVIDER_SITE_OTHER): Payer: BLUE CROSS/BLUE SHIELD | Admitting: Family Medicine

## 2016-05-01 VITALS — BP 130/84 | HR 98 | Temp 101.6°F | Resp 18 | Ht 73.0 in | Wt 210.0 lb

## 2016-05-01 DIAGNOSIS — R059 Cough, unspecified: Secondary | ICD-10-CM

## 2016-05-01 DIAGNOSIS — R05 Cough: Secondary | ICD-10-CM | POA: Diagnosis not present

## 2016-05-01 DIAGNOSIS — R509 Fever, unspecified: Secondary | ICD-10-CM

## 2016-05-01 LAB — POCT INFLUENZA A/B
INFLUENZA A, POC: NEGATIVE
INFLUENZA B, POC: NEGATIVE

## 2016-05-01 MED ORDER — ACETAMINOPHEN 500 MG PO TABS
1000.0000 mg | ORAL_TABLET | Freq: Once | ORAL | Status: AC
  Administered 2016-05-01: 1000 mg via ORAL

## 2016-05-01 MED ORDER — GUAIFENESIN-CODEINE 200-10 MG/5ML PO LIQD: 5.0000 mL | Freq: Three times a day (TID) | ORAL | 0 refills | Status: DC | PRN

## 2016-05-01 MED ORDER — AZITHROMYCIN 250 MG PO TABS: ORAL_TABLET | ORAL | 0 refills | Status: DC

## 2016-05-01 NOTE — Progress Notes (Addendum)
Alejandro Hayes is a 46 y.o. male who presents to Primary Care at [facility] today for fever and cough:  1.  Fever and cough:  Started Tuesday evening (4 days ago).  Has persisted since that time. He is taking acetaminophen 1000 mg every 8 hours for resolution of his fever. States the fever returns as soon as the anti-pyretic wears off. His symptoms are upper respiratory drainage and congestion as well as cough. Cough is productive of yellow sputum.    ROS as above.  Pleuritic chest pain along his cough. No pain with exertion. No actual dyspnea. Chills associated with his fevers. No nausea vomiting. He has had somewhat decreased appetite but is remaining hydrated. General malaise without myalgias.   Patient also mentions he was scratched by his cat a few days ago when trying to put it in a carrier to take it to the vet.  It had been well.  He cleaned the area with soap and water and it has been healing well, no drainage or redness.  Scratched on hands.  No swelling to hands or redness/swelling to arms.    PMH reviewed. Patient is a nonsmoker.   Past Medical History:  Diagnosis Date  . Allergic rhinitis   . Atrial fibrillation (HCC) 07/2012  . Genital herpes   . Genital warts   . Lumbar herniated disc   . NSVT (nonsustained ventricular tachycardia) (HCC)   . Spermatocele    right   Past Surgical History:  Procedure Laterality Date  . CARDIOVERSION N/A 08/18/2012   Procedure: CARDIOVERSION;  Surgeon: Duke Salvia, MD;  Location: [facility] ENDOSCOPY;  Service: Cardiovascular;  Laterality: N/A;  . LAPAROSCOPIC APPENDECTOMY N/A 10/14/2015   Procedure: APPENDECTOMY LAPAROSCOPIC;  Surgeon: Berna Bue, MD;  Location: [facility] ORS;  Service: General;  Laterality: N/A;  . SHOULDER ARTHROSCOPY W/ SUPERIOR LABRAL ANTERIOR POSTERIOR LESION REPAIR  2002   right    Medications reviewed. Current Outpatient Prescriptions  Medication Sig Dispense Refill  . metoprolol succinate (TOPROL-XL) 25 MG 24 hr tablet  Patient may take a whole pill if needed for elevated blood pressure. 90 tablet 3   No current facility-administered medications for this visit.      Physical Exam:  BP 130/84   Pulse 98   Temp (!) 101.6 F (38.7 C)   Resp 18   Ht  (1.854 m)   Wt 210 lb (95.3 kg)   BMI 27.71 kg/m  Gen:  Patient sitting on exam table, appears stated age in no acute distress Head: Normocephalic atraumatic Eyes: EOMI, PERRL, sclera and conjunctiva non-erythematous Ears:  Canals clear bilaterally.  TMs pearly gray bilaterally without erythema or bulging.   Nose:  Nasal turbinates erythematous with clear drainage Mouth: Mucosa membranes moist. Tonsils +2, nonenlarged, non-erythematous. Neck: No cervical lymphadenopathy noted Heart:  RRR, no murmurs auscultated. Pulm:  Good air movement throughout. Does have rhonchi right lower lung bases that do not resolve with coughing. Rest of lungs are clear.  Ext:  Right hand with well healing/healed abrasions noted thenar and hypothenar aspects of hand.  No swelling.  No redness.  Shallow scratches.  No swelling or redness or lymphangitis of Right forearm or upper arm.   Lymph:  No lymphadenopathy of antecubital fossa Right arm.  No lymphadenopathy of Right or Left axilla.     Results for orders placed or performed in visit on 05/01/16  POCT Influenza A/B  Result Value Ref Range   Influenza A, POC Negative Negative  Influenza B, POC Negative Negative    Assessment and Plan:  1.  Cough and fever:  - Likely viral URI. -Flu panel was negative. Today. -He did have concerns for pneumonia with cough, fever, rhonchi on lung exam. Despite being 4 days into his illness his radiographs were completely negative for any evidence of pneumonia.  - Treated and currently has a viral illness with symptomatic treatment. -If still with symptoms by Monday on for a week. Appointment concern would be for pneumonia. Will treat with azithromycin at that point for  community-acquired pneumonia. -If still with symptoms 48 hours after antibiotics he should return to be reevaluated. -Follow-up immediately if any worsening. -Hycodan for cough relief tonight and to help with sleep  2.  Cat scratches: - very superficial - no evidence of lymphangitis or lymph node swelling or even local infection. - do NOT think this is contributing to fever above, especially since he has cough.

## 2016-05-01 NOTE — Patient Instructions (Addendum)
  Your chest xray was completely normal without any signs of pneumonia.   Your flu test was negative.  I think you have a viral illness that should run its course in the next several days.   If you're still having symptoms on Monday, pick up the Azithromycin.  It was good to meet you today.     IF you received an x-ray today, you will receive an invoice from [facility]. Please contact [facility] at [phone] with questions or concerns regarding your invoice.   IF you received labwork today, you will receive an invoice from [facility]. Please contact [facility] at [phone] with questions or concerns regarding your invoice.   Our billing staff will not be able to assist you with questions regarding bills from these companies.  You will be contacted with the lab results as soon as they are available. The fastest way to get your results is to activate your My Chart account. Instructions are located on the last page of this paperwork. If you have not heard from [place] regarding the results in 2 weeks, please contact this office.

## 2016-09-03 ENCOUNTER — Ambulatory Visit: Payer: BLUE CROSS/BLUE SHIELD | Admitting: Internal Medicine

## 2016-09-08 ENCOUNTER — Encounter: Payer: Self-pay | Admitting: Internal Medicine

## 2016-09-08 ENCOUNTER — Ambulatory Visit (INDEPENDENT_AMBULATORY_CARE_PROVIDER_SITE_OTHER): Payer: BLUE CROSS/BLUE SHIELD | Admitting: Internal Medicine

## 2016-09-08 VITALS — BP 128/86 | HR 57 | Ht 73.0 in | Wt 220.0 lb

## 2016-09-08 DIAGNOSIS — I480 Paroxysmal atrial fibrillation: Secondary | ICD-10-CM

## 2016-09-08 NOTE — Patient Instructions (Addendum)
Medication Instructions:  Your physician recommends that you continue on your current medications as directed. Please refer to the Current Medication list given to you today.   Labwork: None ordered  Testing/Procedures: None ordered  Follow-Up: Your physician wants you to follow-up in: 1 year with Francis Dowseenee Ursuy. You will receive a reminder letter in the mail two months in advance. If you don't receive a letter, please call our office to schedule the follow-up appointment.   Any Other Special Instructions Will Be Listed Below (If Applicable).     If you need a refill on your cardiac medications before your next appointment, please call your pharmacy.

## 2016-09-08 NOTE — Progress Notes (Signed)
      Patient Care Team: Laurann MontanaWhite, Cynthia, MD as PCP - General (Family Medicine)   HPI  Alejandro Hayes is a 46 y.o. male Seen in followup for ventricular ectopy it appeared to be left bundle inferior axis. ECG and echo were normal.SAECG niormal  He subsequently presented with atrial fibrillation and underwent cardioversion    He has had intercurrent afib in June which he thought might be related to alcohol  He is continued low-dose beta blockers. He is tolerating without complication.    Past Medical History:  Diagnosis Date  . Allergic rhinitis   . Atrial fibrillation (HCC) 07/2012  . Genital herpes   . Genital warts   . Lumbar herniated disc   . NSVT (nonsustained ventricular tachycardia) (HCC)   . Spermatocele    right    Past Surgical History:  Procedure Laterality Date  . CARDIOVERSION N/A 08/18/2012   Procedure: CARDIOVERSION;  Surgeon: Duke SalviaSteven C Kodiak Rollyson, MD;  [facility];  Service: Cardiovascular;  Laterality: N/A;  . LAPAROSCOPIC APPENDECTOMY N/A 10/14/2015   Procedure: APPENDECTOMY LAPAROSCOPIC;  Surgeon: Berna Buehelsea A Connor, MD;  Location: [facility] ORS;  Service: General;  Laterality: N/A;  . SHOULDER ARTHROSCOPY W/ SUPERIOR LABRAL ANTERIOR POSTERIOR LESION REPAIR  2002   right    Current Outpatient Prescriptions  Medication Sig Dispense Refill  . azithromycin (ZITHROMAX) 250 MG tablet Take 2 pills today and then 1 pill daily after that. 6 tablet 0  . Guaifenesin-Codeine 200-10 MG/5ML LIQD Take 5 mLs by mouth 3 (three) times daily as needed. Dispense QS x 1 week 1 Bottle 0  . metoprolol succinate (TOPROL-XL) 25 MG 24 hr tablet Patient may take a whole pill if needed for elevated blood pressure. 90 tablet 3   No current facility-administered medications for this visit.     No Known Allergies  Review of Systems negative except from HPI and PMH  Physical Exam BP 128/86   Pulse (!) 57   Ht 6\' 1"  (1.854 m)   Wt 220 lb (99.8 kg)   SpO2 97%   BMI 29.03 kg/m   Well developed and nourished in no acute distress HENT normal Neck supple with JVP-flat Carotids brisk and full without bruits Clear Regular rate and rhythm, no murmurs or gallops Abd-soft with active BS without hepatomegaly No Clubbing cyanosis edema Skin-warm and dry A & Oriented  Grossly normal sensory and motor function   .   ECG: Sinus Rhythm  @ 57            Intervals  16/ 10/41 Axis 74    Assessment and  Plan   PVCs  Atrial fibrillation  . We'll continue him on metoprolol.   No indication for anticoagulation

## 2016-09-10 DIAGNOSIS — Z125 Encounter for screening for malignant neoplasm of prostate: Secondary | ICD-10-CM | POA: Diagnosis not present

## 2016-09-10 DIAGNOSIS — Z0000 Encounter for general adult medical examination without abnormal findings: Secondary | ICD-10-CM | POA: Diagnosis not present

## 2016-09-10 DIAGNOSIS — Z23 Encounter for immunization: Secondary | ICD-10-CM | POA: Diagnosis not present

## 2016-09-10 DIAGNOSIS — Z13220 Encounter for screening for lipoid disorders: Secondary | ICD-10-CM | POA: Diagnosis not present

## 2016-11-08 DIAGNOSIS — Z3009 Encounter for other general counseling and advice on contraception: Secondary | ICD-10-CM | POA: Diagnosis not present

## 2017-01-19 DIAGNOSIS — L918 Other hypertrophic disorders of the skin: Secondary | ICD-10-CM | POA: Diagnosis not present

## 2017-01-31 ENCOUNTER — Other Ambulatory Visit: Payer: Self-pay | Admitting: Internal Medicine

## 2017-02-04 DIAGNOSIS — Z302 Encounter for sterilization: Secondary | ICD-10-CM | POA: Diagnosis not present

## 2017-02-14 DIAGNOSIS — N509 Disorder of male genital organs, unspecified: Secondary | ICD-10-CM | POA: Diagnosis not present

## 2017-02-22 DIAGNOSIS — H5212 Myopia, left eye: Secondary | ICD-10-CM | POA: Diagnosis not present

## 2017-03-07 DIAGNOSIS — R05 Cough: Secondary | ICD-10-CM | POA: Diagnosis not present

## 2017-03-10 DIAGNOSIS — Z09 Encounter for follow-up examination after completed treatment for conditions other than malignant neoplasm: Secondary | ICD-10-CM | POA: Diagnosis not present

## 2017-03-10 DIAGNOSIS — R05 Cough: Secondary | ICD-10-CM | POA: Diagnosis not present

## 2017-03-10 DIAGNOSIS — H1032 Unspecified acute conjunctivitis, left eye: Secondary | ICD-10-CM | POA: Diagnosis not present

## 2017-03-10 DIAGNOSIS — J019 Acute sinusitis, unspecified: Secondary | ICD-10-CM | POA: Diagnosis not present

## 2017-10-21 DIAGNOSIS — Z23 Encounter for immunization: Secondary | ICD-10-CM | POA: Diagnosis not present

## 2017-11-16 ENCOUNTER — Other Ambulatory Visit: Payer: Self-pay | Admitting: Internal Medicine

## 2017-11-29 DIAGNOSIS — Z0000 Encounter for general adult medical examination without abnormal findings: Secondary | ICD-10-CM | POA: Diagnosis not present

## 2017-11-29 DIAGNOSIS — I480 Paroxysmal atrial fibrillation: Secondary | ICD-10-CM | POA: Diagnosis not present

## 2017-12-09 ENCOUNTER — Other Ambulatory Visit: Payer: Self-pay | Admitting: Internal Medicine

## 2017-12-23 ENCOUNTER — Other Ambulatory Visit: Payer: Self-pay | Admitting: Internal Medicine

## 2018-01-06 NOTE — Progress Notes (Signed)
Cardiology Office Note Date:  01/10/2018  Patient ID:  Alejandro Hayes, DOB 10/09/1970, MRN [id number] PCP:  Laurann MontanaWhite, Cynthia, MD  Electrophysiologist: Dr. Graciela HusbandsKlein   Chief Complaint: annual visit (over-due)  History of Present Illness: Alejandro Hayes is a 48 y.o. male with history of PVCs and paroxysmal AFib.  He comes in today to be seen for Dr. Graciela HusbandsKlein.  He last saw him in Sept 2018.  At that visit noted PVCs appeared to be  left bundle inferior axis.  W/u included normal EKG and echo as well as normal SAEKG.  He eventually developed with an episode of AF that was DCCV'd in 2014, and a second event 2017(this thought perhaps triggered by ETOH on vacation in [place]).  CHA2Ds2Vasc score of zero, with no indication for a/c.  He was doing well, to continue BB tx, planned for an annual visit.  Since last being seen in clinic, the patient reports doing very well.  He is planning on restarting aerobic exercise soon.  He is aware that caffeine is a trigger for his tachycardia and is trying to limit use.  He denies chest pain, shortness of breath, LE edema, dizziness, syncope.    Past Medical History:  Diagnosis Date  . Allergic rhinitis   . Atrial fibrillation (HCC) 07/2012  . Genital herpes   . Genital warts   . Lumbar herniated disc   . NSVT (nonsustained ventricular tachycardia) (HCC)   . Spermatocele    right    Past Surgical History:  Procedure Laterality Date  . CARDIOVERSION N/A 08/18/2012   Procedure: CARDIOVERSION;  Surgeon: Duke SalviaSteven C Klein, MD;  [facility];  Service: Cardiovascular;  Laterality: N/A;  . LAPAROSCOPIC APPENDECTOMY N/A 10/14/2015   Procedure: APPENDECTOMY LAPAROSCOPIC;  Surgeon: Berna Buehelsea A Connor, MD;  Location: [facility]S;  Service: General;  Laterality: N/A;  . SHOULDER ARTHROSCOPY W/ SUPERIOR LABRAL ANTERIOR POSTERIOR LESION REPAIR  2002   right    Current Outpatient Medications  Medication Sig Dispense Refill  . metoprolol succinate (TOPROL-XL) 25 MG 24  hr tablet Take 1 tablet (25 mg total) by mouth daily. 90 tablet 4   No current facility-administered medications for this visit.     Allergies:   Patient has no known allergies.   Social History:  The patient  reports that he has quit smoking. He has never used smokeless tobacco. He reports current alcohol use of about 1.0 - 2.0 standard drinks of alcohol per week. He reports that he does not use drugs.   Family History:  The patient's family history includes Alzheimer's disease in his maternal grandfather; Alzheimer's disease (age of onset: 2987) in his paternal grandfather; Arthritis in his mother; Cancer in his paternal grandmother; Diabetes in his maternal grandmother; Hypertension in his father; Stroke (age of onset: 4856) in his father.  ROS:  Please see the history of present illness.  All other systems are reviewed and otherwise negative.   PHYSICAL EXAM:  VS:  BP 136/84   Pulse 84   Ht 6\' 1"  (1.854 m)   Wt 219 lb 6.4 oz (99.5 kg)   SpO2 99%   BMI 28.95 kg/m  BMI: Body mass index is 28.95 kg/m. Well nourished, well developed, in no acute distress  HEENT: normocephalic, atraumatic  Neck: no JVD, carotid bruits or masses Cardiac:  RRR; no significant murmurs, no rubs, or gallops Lungs:  CTA b/l, no wheezing, rhonchi or rales  Abd: soft, nontender MS: no deformity or atrophy Ext: no edema  Skin: warm and dry, no rash Neuro:  No gross deficits appreciated Psych: euthymic mood, full affect   EKG:  Done today and reviewed by myself sinus rhythm, rate 69  03/22/14: TTE Study Conclusions - Left ventricle: The cavity size was normal. Wall thickness was increased in a pattern of mild LVH. Systolic function was normal. The estimated ejection fraction was in the range of 60% to 65%. Wall motion was normal; there were no regional wall motion abnormalities. Left ventricular diastolic function parameters were normal. - Mitral valve: There was mild regurgitation. - Left  atrium: The atrium was mildly dilated. - Right atrium: The atrium was mildly dilated.  Recent Labs: No results found for requested labs within last 8760 hours.  No results found for requested labs within last 8760 hours.   CrCl cannot be calculated (Patient's most recent lab result is older than the maximum 21 days allowed.).   Wt Readings from Last 3 Encounters:  01/10/18 219 lb 6.4 oz (99.5 kg)  09/08/16 220 lb (99.8 kg)  05/01/16 210 lb (95.3 kg)     Other studies reviewed: Additional studies/records reviewed today include: summarized above  ASSESSMENT AND PLAN:  1. Paroxysmal atrial fibrillation Doing well without clinical recurrence CHADS2VASC is 0, no indication for [facility] Lifestyle modification reviewed  2. PVCs Stable No change required today  3. Snoring He is planning to re-evaluate after some weight loss I have encouraged sleep study through his PCP if he continues to have snoring    Disposition: F/u with Dr Graciela Husbands 1 year   Current medicines are reviewed at length with the patient today.  The patient did not have any concerns regarding medicines.  Signed, Gypsy Balsam, NP 01/10/2018 9:15 AM  [facility] [address] [place] [address] [phone] (office)  [phone] (fax)

## 2018-01-10 ENCOUNTER — Encounter: Payer: Self-pay | Admitting: Nurse Practitioner

## 2018-01-10 ENCOUNTER — Ambulatory Visit (INDEPENDENT_AMBULATORY_CARE_PROVIDER_SITE_OTHER): Payer: BLUE CROSS/BLUE SHIELD | Admitting: Nurse Practitioner

## 2018-01-10 VITALS — BP 136/84 | HR 84 | Ht 73.0 in | Wt 219.4 lb

## 2018-01-10 DIAGNOSIS — I493 Ventricular premature depolarization: Secondary | ICD-10-CM

## 2018-01-10 DIAGNOSIS — I480 Paroxysmal atrial fibrillation: Secondary | ICD-10-CM | POA: Diagnosis not present

## 2018-01-10 MED ORDER — METOPROLOL SUCCINATE ER 25 MG PO TB24: 25.0000 mg | ORAL_TABLET | Freq: Every day | ORAL | 4 refills | Status: DC

## 2018-01-10 NOTE — Patient Instructions (Addendum)
Medication Instructions:  Your physician recommends that you continue on your current medications as directed. Please refer to the Current Medication list given to you today.  If you need a refill on your cardiac medications before your next appointment, please call your pharmacy.   Lab work:NONE ORDERED  TODAY]  If you have labs (blood work) drawn today and your tests are completely normal, you will receive your results only by: . MyChart Message (if you have MyChart) OR . A paper copy in the mail If you have any lab test that is abnormal or we need to change your treatment, we will call you to review the results.  Testing/Procedures: NONE ORDERED  TODAY   Follow-Up: At [facility], you and your health needs are our priority.  As part of our continuing mission to provide you with exceptional heart care, we have created designated Provider Care Teams.  These Care Teams include your primary Cardiologist (physician) and Advanced Practice Providers (APPs -  Physician Assistants and Nurse Practitioners) who all work together to provide you with the care you need, when you need it. You will need a follow up appointment in 1 years.  Please call our office 2 months in advance to schedule this appointment.  You may see Dr. Klein  or one of the following Advanced Practice Providers on your designated Care Team:   Amber Seiler, NP . Renee Ursuy, PA-C  Any Other Special Instructions Will Be Listed Below (If Applicable).    

## 2018-02-28 DIAGNOSIS — D485 Neoplasm of uncertain behavior of skin: Secondary | ICD-10-CM | POA: Diagnosis not present

## 2018-02-28 DIAGNOSIS — L853 Xerosis cutis: Secondary | ICD-10-CM | POA: Diagnosis not present

## 2018-02-28 DIAGNOSIS — D1801 Hemangioma of skin and subcutaneous tissue: Secondary | ICD-10-CM | POA: Diagnosis not present

## 2018-02-28 DIAGNOSIS — D224 Melanocytic nevi of scalp and neck: Secondary | ICD-10-CM | POA: Diagnosis not present

## 2018-02-28 DIAGNOSIS — D2261 Melanocytic nevi of right upper limb, including shoulder: Secondary | ICD-10-CM | POA: Diagnosis not present

## 2018-02-28 DIAGNOSIS — D225 Melanocytic nevi of trunk: Secondary | ICD-10-CM | POA: Diagnosis not present

## 2018-03-13 DIAGNOSIS — L988 Other specified disorders of the skin and subcutaneous tissue: Secondary | ICD-10-CM | POA: Diagnosis not present

## 2018-03-13 DIAGNOSIS — D485 Neoplasm of uncertain behavior of skin: Secondary | ICD-10-CM | POA: Diagnosis not present

## 2018-10-28 DIAGNOSIS — Z23 Encounter for immunization: Secondary | ICD-10-CM | POA: Diagnosis not present

## 2018-12-06 ENCOUNTER — Telehealth: Payer: Self-pay | Admitting: Internal Medicine

## 2018-12-06 NOTE — Telephone Encounter (Signed)
Patient has appt with Tommye Standard on 12/19/18. He is requesting an EKG to be done for this appt.

## 2018-12-14 ENCOUNTER — Other Ambulatory Visit: Payer: Self-pay

## 2018-12-14 DIAGNOSIS — Z20822 Contact with and (suspected) exposure to covid-19: Secondary | ICD-10-CM

## 2018-12-16 LAB — NOVEL CORONAVIRUS, NAA: SARS-CoV-2, NAA: NOT DETECTED

## 2018-12-19 ENCOUNTER — Ambulatory Visit: Payer: BLUE CROSS/BLUE SHIELD | Admitting: Physician Assistant

## 2018-12-26 IMAGING — DX DG CHEST 2V
2 series · 2 of 2 positions shown · non-contrast
Comparison: None.

CLINICAL DATA: Fever and cough

EXAM:
CHEST  2 VIEW

[chest pa]
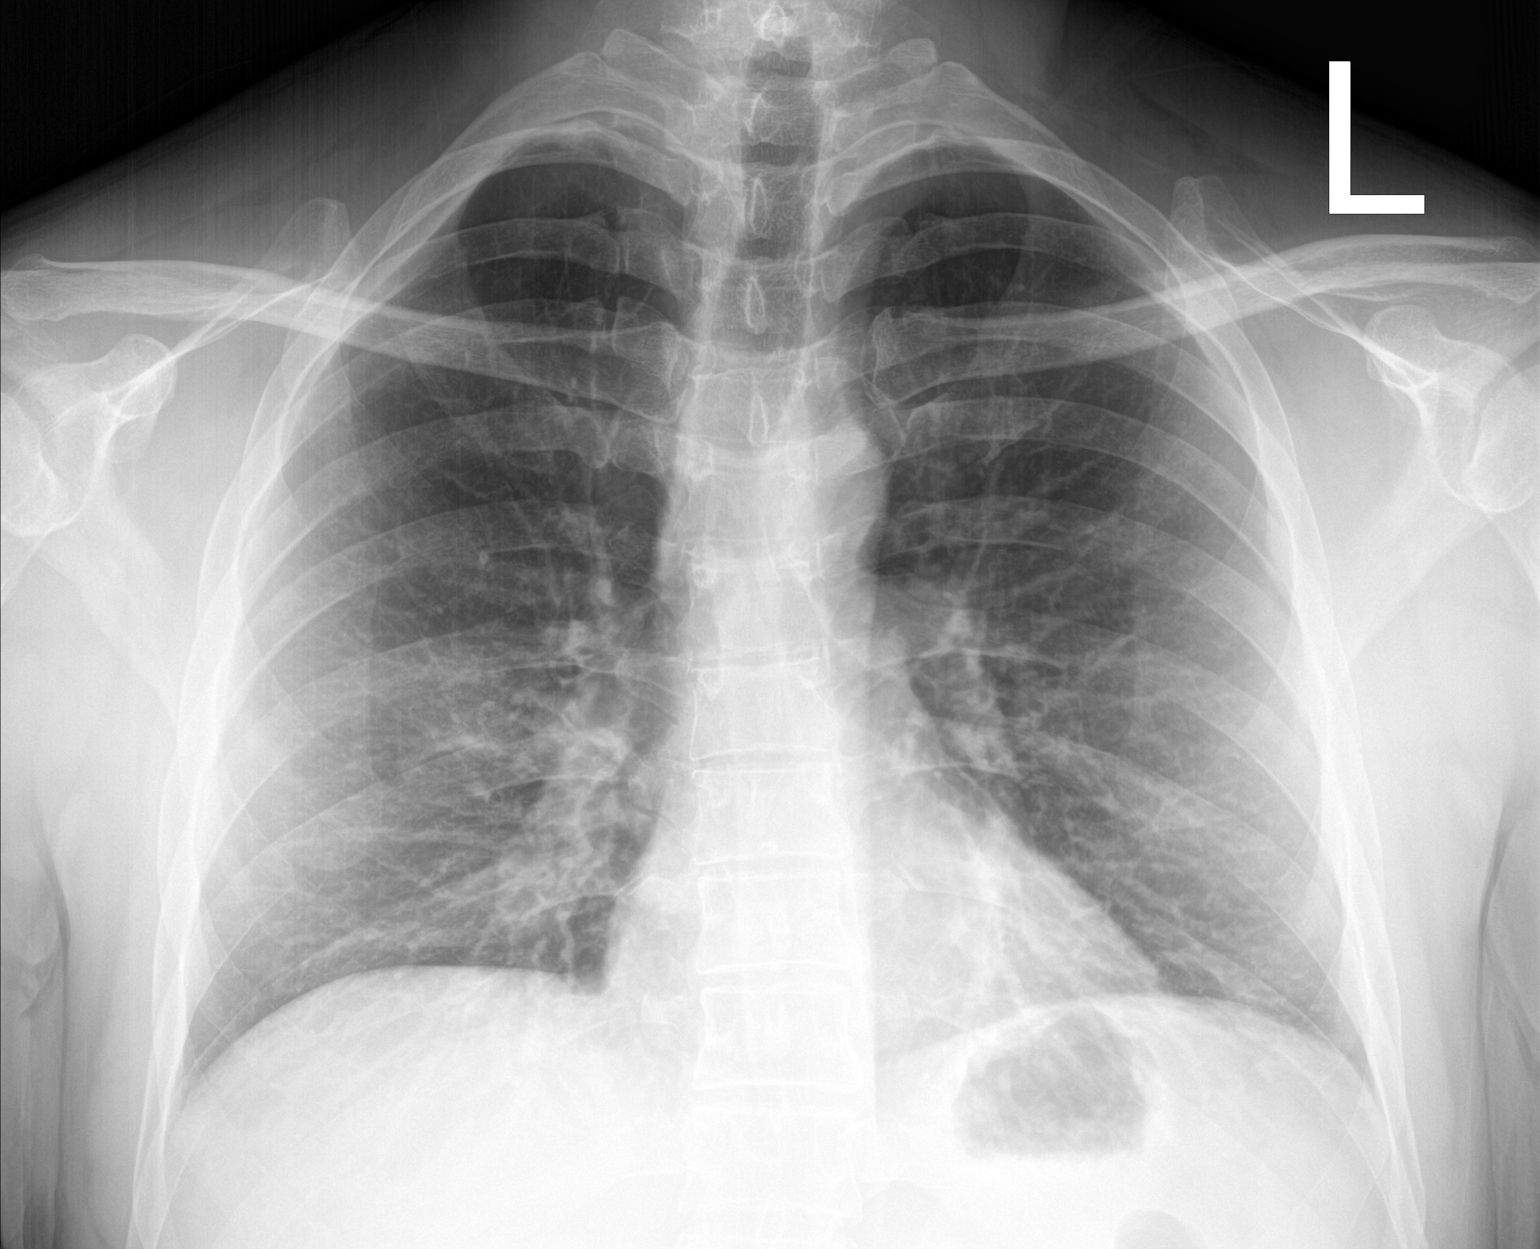

[chest lat]
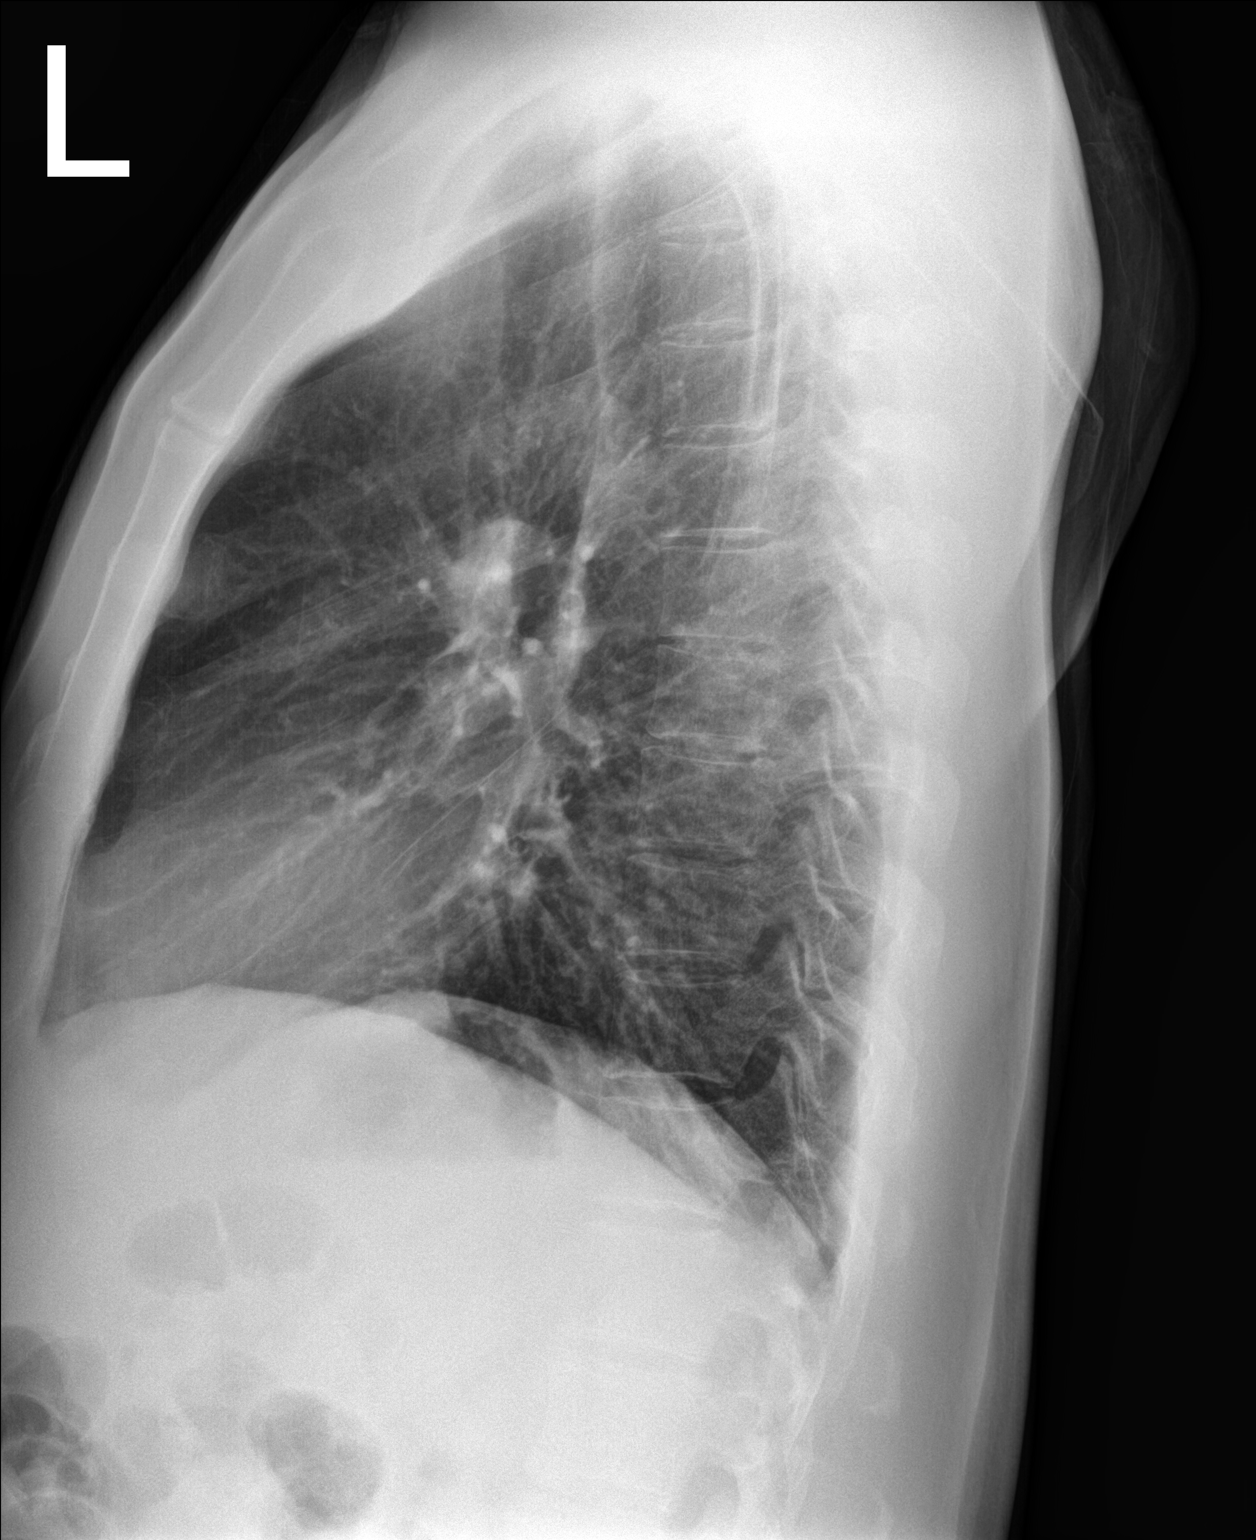

[2 of 2 positions shown; findings below may reference images not displayed]

FINDINGS: Lungs are clear. Heart size and pulmonary vascularity are normal. No
adenopathy. No bone lesions.
IMPRESSION: No edema or consolidation.

## 2018-12-31 NOTE — Progress Notes (Signed)
Cardiology Office Note Date:  01/01/2019  Patient ID:  Francina AmesRaymond C Schellinger, DOB 02/13/1970, MRN [id number] PCP:  Laurann MontanaWhite, Cynthia, MD  Cardiologist:  Dr. Graciela HusbandsKlein   Chief Complaint: annaul visit, CP  History of Present Illness: Lolita LenzRaymond C Sliker is a 48 y.o. male with history of PVCs and paroxysmal Afib.  He comes ti the office today to be seen for Dr. Graciela HusbandsKlein, last seen by him 2018, at that time mentions v ectopy appeared to be left bundle inferior axis. ECG and echo were normal.SAECG normal.  developed with an episode of AF that was DCCV'd in 2014, and a second event 2017(this thought perhaps triggered by ETOH on vacation in [place]).   He was on metoprolol and had no indication for a/c.  Most recently saw A. Glory BuffSeiler, NP Jan 2020, he was doing well, limiting caffiene that was a known trigger for his palpitations, planned to start exercising, weight loss, discussed snoring with plans for lifestyle modification strategy first, and f/u with his PMD.  He is doing well all in all.  Mentions about a month ago he was lifting his 49 year old up with outstretched arm, and felt some CP, since then on occasion he is aware of the discomfort, particularly with certain upper body movements such as when rolling onto his L side, is aware of the discomfort.  It is not associated with any other symptoms.  Inferior to L breast, runs along inferior rib border area.  It is not exertional.  He checked his BP once when he had felt the pain and was 139/100 and this prompted his visit He denies any changes to his exertional capacity, no rest SOB, no DOE, no symptoms of PND or orthopnea.   He notices hi HR fast when he has had more then usual coffee and with increased periods of stress, it feels fast, but not sudden on or off, and when he is aware of it, just tends to try and relax and it will come down.  Does not feel like AFib, and will take notice maybe 3x a month. He has gotten out of an exercise routine, but has tried to be  good about diet and has managed to keep weight off/lose a couple.  Also mentions that he has gotten in the habit of having a couple glasses of wine nightly, and sometimes 3. His wife feels he has sleep apnea, snoring at night, has not yet gotten this check out. He denies any dizzy spells, no near syncope or syncope He denies any change to his PMHx  Past Medical History:  Diagnosis Date  . Allergic rhinitis   . Atrial fibrillation (HCC) 07/2012  . Genital herpes   . Genital warts   . Lumbar herniated disc   . NSVT (nonsustained ventricular tachycardia) (HCC)   . Spermatocele    right    Past Surgical History:  Procedure Laterality Date  . CARDIOVERSION N/A 08/18/2012   Procedure: CARDIOVERSION;  Surgeon: Duke SalviaSteven C Klein, MD;  [facility];  Service: Cardiovascular;  Laterality: N/A;  . LAPAROSCOPIC APPENDECTOMY N/A 10/14/2015   Procedure: APPENDECTOMY LAPAROSCOPIC;  Surgeon: Berna Buehelsea A Connor, MD;  Location: [facility];  Service: General;  Laterality: N/A;  . SHOULDER ARTHROSCOPY W/ SUPERIOR LABRAL ANTERIOR POSTERIOR LESION REPAIR  2002   right    Current Outpatient Medications  Medication Sig Dispense Refill  . metoprolol succinate (TOPROL-XL) 25 MG 24 hr tablet Take 1 tablet (25 mg total) by mouth daily. 90 tablet 4  No current facility-administered medications for this visit.    Allergies:   Patient has no known allergies.   Social History:  The patient  reports that he has quit smoking. He has never used smokeless tobacco. He reports current alcohol use of about 1.0 - 2.0 standard drinks of alcohol per week. He reports that he does not use drugs.   Family History:  The patient's family history includes Alzheimer's disease in his maternal grandfather; Alzheimer's disease (age of onset: 57) in his paternal grandfather; Arthritis in his mother; Cancer in his paternal grandmother; Diabetes in his maternal grandmother; Hypertension in his father; Stroke (age of onset: 53) in  his father.  ROS:  Please see the history of present illness.  All other systems are reviewed and otherwise negative.   PHYSICAL EXAM:  VS:  BP 128/74   Pulse 67   Ht 6\' 1"  (1.854 m)   Wt 213 lb (96.6 kg)   BMI 28.10 kg/m  BMI: Body mass index is 28.1 kg/m. Well nourished, well developed, in no acute distress  HEENT: normocephalic, atraumatic  Neck: no JVD, carotid bruits or masses Cardiac: RRR; no significant murmurs, no rubs, or gallops Lungs: CTA b/l, no wheezing, rhonchi or rales  Abd: soft, nontender MS: no deformity or atrophy Ext: no edema  Skin: warm and dry, no rash Neuro:  No gross deficits appreciated Psych: euthymic mood, full affect    EKG:  Done today and reviewed by myself shows SR 67bpm, some baseline artifact , PVC, no changes from prior Rhythm strip without ectopy, cleaner baseline, no ST/T changes   03/22/14: TTE Study Conclusions - Left ventricle: The cavity size was normal. Wall thickness was increased in a pattern of mild LVH. Systolic function was normal. The estimated ejection fraction was in the range of 60% to 65%. Wall motion was normal; there were no regional wall motion abnormalities. Left ventricular diastolic function parameters were normal. - Mitral valve: There was mild regurgitation. - Left atrium: The atrium was mildly dilated. - Right atrium: The atrium was mildly dilated.    Recent Labs: No results found for requested labs within last 8760 hours.  No results found for requested labs within last 8760 hours.   CrCl cannot be calculated (Patient's most recent lab result is older than the maximum 21 days allowed.).   Wt Readings from Last 3 Encounters:  01/01/19 213 lb (96.6 kg)  01/10/18 219 lb 6.4 oz (99.5 kg)  09/08/16 220 lb (99.8 kg)     Other studies reviewed: Additional studies/records reviewed today include: summarized above  ASSESSMENT AND PLAN:  1. PVCs 2. Paroxysmal AFib     CHA2DS2Vasc is still zero,  not on a/c     continue metoprolol  Revisited lifestyle management/healthyhabits Try to find time to get back to regular exercise (he enjoys swimming), reduce his caffeine and ETOH, and better stress management He is very motivated Fast HR do not feel like his AFib to him, but an awareness of his heart beating fast (as discussed above)   3. snorning     He sees his PMD for an annual visit soon, will discuss gettingevaluated for sleep apnea     Discussed the importance of treatment if he has OSA  4. CP     Atypical and sounds musculoskeletal     No EKG changes     Discussed if changes in behavior or escalates to notify us   5. High BP reading at home x1  Looks good here     He is asked to monitor at home if regularly 140/80 or > to let us know     Anticipate life style modifications will help with this as well.    Disposition: F/u with [place] annually, sooner if needed  Current medicines are reviewed at length with the patient today.  The patient did not have any concerns regarding medicines.  Norma Fredrickson, PA-C 01/01/2019 8:50 AM     [facility] [address] [place] [address] [phone] (office)  [phone] (fax)

## 2019-01-01 ENCOUNTER — Other Ambulatory Visit: Payer: Self-pay

## 2019-01-01 ENCOUNTER — Ambulatory Visit (INDEPENDENT_AMBULATORY_CARE_PROVIDER_SITE_OTHER): Payer: BC Managed Care – PPO | Admitting: Physician Assistant

## 2019-01-01 VITALS — BP 128/74 | HR 67 | Ht 73.0 in | Wt 213.0 lb

## 2019-01-01 DIAGNOSIS — R0789 Other chest pain: Secondary | ICD-10-CM

## 2019-01-01 DIAGNOSIS — I480 Paroxysmal atrial fibrillation: Secondary | ICD-10-CM | POA: Diagnosis not present

## 2019-01-01 NOTE — Patient Instructions (Signed)
Medication Instructions:  Your physician recommends that you continue on your current medications as directed. Please refer to the Current Medication list given to you today.  *If you need a refill on your cardiac medications before your next appointment, please call your pharmacy*  Lab Work: NONE ORDERED  TODAY   If you have labs (blood work) drawn today and your tests are completely normal, you will receive your results only by: . MyChart Message (if you have MyChart) OR . A paper copy in the mail If you have any lab test that is abnormal or we need to change your treatment, we will call you to review the results.  Testing/Procedures: NONE ORDERED  TODAY   Follow-Up: At [facility], you and your health needs are our priority.  As part of our continuing mission to provide you with exceptional heart care, we have created designated Provider Care Teams.  These Care Teams include your primary Cardiologist (physician) and Advanced Practice Providers (APPs -  Physician Assistants and Nurse Practitioners) who all work together to provide you with the care you need, when you need it.  Your next appointment:   1 year(s)  The format for your next appointment:   In Person  Provider:   You may see Dr. Klein  or one of the following Advanced Practice Providers on your designated Care Team:    Amber Seiler, NP  Renee Ursuy, PA-C  Michael "Andy" Tillery, PA-C   Other Instructions  

## 2019-02-08 DIAGNOSIS — A609 Anogenital herpesviral infection, unspecified: Secondary | ICD-10-CM | POA: Diagnosis not present

## 2019-02-08 DIAGNOSIS — Z125 Encounter for screening for malignant neoplasm of prostate: Secondary | ICD-10-CM | POA: Diagnosis not present

## 2019-02-08 DIAGNOSIS — Z13220 Encounter for screening for lipoid disorders: Secondary | ICD-10-CM | POA: Diagnosis not present

## 2019-02-08 DIAGNOSIS — Z0000 Encounter for general adult medical examination without abnormal findings: Secondary | ICD-10-CM | POA: Diagnosis not present

## 2019-02-22 ENCOUNTER — Other Ambulatory Visit: Payer: Self-pay | Admitting: Nurse Practitioner

## 2019-03-17 ENCOUNTER — Ambulatory Visit: Payer: BC Managed Care – PPO | Attending: Internal Medicine

## 2019-03-17 DIAGNOSIS — Z23 Encounter for immunization: Secondary | ICD-10-CM

## 2019-03-17 NOTE — Progress Notes (Signed)
   Covid-19 Vaccination Clinic  Name:  [name]    MRN: [id number] DOB: 1970/10/07  03/17/2019  [name] was observed post Covid-19 immunization for 15 minutes without incident. He was provided with Vaccine Information Sheet and instruction to access the V-Safe system.   [name] was instructed to call 911 with any severe reactions post vaccine: Marland Kitchen Difficulty breathing  . Swelling of face and throat  . A fast heartbeat  . A bad rash all over body  . Dizziness and weakness   Immunizations Administered    Name Date Dose VIS Date Route   Pfizer COVID-19 Vaccine 03/17/2019  4:51 PM 0.3 mL 12/15/2018 Intramuscular   Manufacturer: [employer], [employer]   Lot: [id number]   NDC: [address]-7

## 2019-04-10 ENCOUNTER — Ambulatory Visit: Payer: BC Managed Care – PPO

## 2019-04-11 ENCOUNTER — Ambulatory Visit: Payer: BC Managed Care – PPO

## 2019-04-16 ENCOUNTER — Ambulatory Visit: Payer: BC Managed Care – PPO | Attending: Internal Medicine

## 2019-04-16 DIAGNOSIS — Z23 Encounter for immunization: Secondary | ICD-10-CM

## 2019-04-16 NOTE — Progress Notes (Signed)
   Covid-19 Vaccination Clinic  Name:  Alejandro Hayes    MRN: [id number] DOB: January 21, 1970  04/16/2019  [name] was observed post Covid-19 immunization for 15 minutes without incident. He was provided with Vaccine Information Sheet and instruction to access the V-Safe system.   [name] was instructed to call 911 with any severe reactions post vaccine: Marland Kitchen Difficulty breathing  . Swelling of face and throat  . A fast heartbeat  . A bad rash all over body  . Dizziness and weakness   Immunizations Administered    Name Date Dose VIS Date Route   Pfizer COVID-19 Vaccine 04/16/2019 11:35 AM 0.3 mL 12/15/2018 Intramuscular   Manufacturer: [employer], [employer]   Lot: [id number]   NDC: [address]-7

## 2019-05-16 DIAGNOSIS — D2271 Melanocytic nevi of right lower limb, including hip: Secondary | ICD-10-CM | POA: Diagnosis not present

## 2019-05-16 DIAGNOSIS — D225 Melanocytic nevi of trunk: Secondary | ICD-10-CM | POA: Diagnosis not present

## 2019-05-16 DIAGNOSIS — L918 Other hypertrophic disorders of the skin: Secondary | ICD-10-CM | POA: Diagnosis not present

## 2019-05-16 DIAGNOSIS — D2261 Melanocytic nevi of right upper limb, including shoulder: Secondary | ICD-10-CM | POA: Diagnosis not present

## 2019-09-06 DIAGNOSIS — K625 Hemorrhage of anus and rectum: Secondary | ICD-10-CM | POA: Diagnosis not present

## 2019-09-06 DIAGNOSIS — Z86010 Personal history of colonic polyps: Secondary | ICD-10-CM | POA: Diagnosis not present

## 2019-10-23 DIAGNOSIS — Z1159 Encounter for screening for other viral diseases: Secondary | ICD-10-CM | POA: Diagnosis not present

## 2019-10-26 DIAGNOSIS — Z86010 Personal history of colonic polyps: Secondary | ICD-10-CM | POA: Diagnosis not present

## 2019-10-26 DIAGNOSIS — K5730 Diverticulosis of large intestine without perforation or abscess without bleeding: Secondary | ICD-10-CM | POA: Diagnosis not present

## 2019-10-26 DIAGNOSIS — K640 First degree hemorrhoids: Secondary | ICD-10-CM | POA: Diagnosis not present

## 2019-11-16 DIAGNOSIS — Z23 Encounter for immunization: Secondary | ICD-10-CM | POA: Diagnosis not present

## 2019-12-07 ENCOUNTER — Ambulatory Visit: Payer: BC Managed Care – PPO | Attending: Internal Medicine

## 2019-12-07 DIAGNOSIS — Z23 Encounter for immunization: Secondary | ICD-10-CM

## 2019-12-07 NOTE — Progress Notes (Signed)
   Covid-19 Vaccination Clinic  Name:  JIMMEY HENGEL    MRN: [id number] DOB: 20-Jun-1970  12/07/2019  Mr. Gully was observed post Covid-19 immunization for 15 minutes without incident. He was provided with Vaccine Information Sheet and instruction to access the V-Safe system.   Mr. Mcwhirter was instructed to call 911 with any severe reactions post vaccine: Marland Kitchen Difficulty breathing  . Swelling of face and throat  . A fast heartbeat  . A bad rash all over body  . Dizziness and weakness   Immunizations Administered    Name Date Dose VIS Date Route   Pfizer COVID-19 Vaccine 12/07/2019  5:40 PM 0.3 mL 10/24/2019 Intramuscular   Manufacturer: [employer], [employer]   Lot: [id number]   NDC: [address]-3

## 2019-12-12 ENCOUNTER — Other Ambulatory Visit: Payer: Self-pay | Admitting: Nurse Practitioner

## 2019-12-30 NOTE — Progress Notes (Signed)
Cardiology Office Note Date:  01/01/2020  Patient ID:  Alejandro Hayes, Alejandro Hayes [birth year], MRN [id number] PCP:  Laurann Montana, MD  Cardiologist:  Dr. Graciela Husbands   Chief Complaint:    annaul visit  History of Present Illness: Alejandro Hayes is a 49 y.o. male with history of PVCs and paroxysmal Afib.  He comes ti the office today to be seen for Dr. Graciela Husbands, last seen by him 2018, at that time mentions v ectopy appeared to be left bundle inferior axis. ECG and echo were normal.SAECG normal.  developed with an episode of AF that was DCCV'd in 2014, and a second event 2017(this thought perhaps triggered by ETOH on vacation in [place]).   He was on metoprolol and had no indication for a/c.  I saw him Dec 2020 He is doing well all in all.  Mentions about a month ago he was lifting his 49 year old up with outstretched arm, and felt some CP, since then on occasion he is aware of the discomfort, particularly with certain upper body movements such as when rolling onto his L side, is aware of the discomfort.  It is not associated with any other symptoms.  Inferior to L breast, runs along inferior rib border area.  It is not exertional.  He checked his BP once when he had felt the pain and was 139/100 and this prompted his visit He denies any changes to his exertional capacity, no rest SOB, no DOE, no symptoms of PND or orthopnea.   He notices hi HR fast when he has had more then usual coffee and with increased periods of stress, it feels fast, but not sudden on or off, and when he is aware of it, just tends to try and relax and it will come down.  Does not feel like AFib, and will take notice maybe 3x a month. He has gotten out of an exercise routine, but has tried to be good about diet and has managed to keep weight off/lose a couple.  Also mentions that he has gotten in the habit of having a couple glasses of wine nightly, and sometimes 3. His wife feels he has sleep apnea, snoring at night, has not yet gotten  this check out. He denies any dizzy spells, no near syncope or syncope He denies any change to his PMHx CHA2DS2Vasc remained zero, off a/c Urged to f/u on sleep study with his PMD Discussed regular exercise and lifestyle strategies for BP, AFib, heart health.   TODAY He is doing well, though mentions occassionaly a low left chest pain that concerns him. It is random, not associated with any articular position or exertion, just is there sometimes. He has not noted anything that seems to make it worse or better is just there and sometime later is gone. Is not tender to touch.   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 No SOB, DOE> NO dizzy spells, near syncope or syncope. No palpitations He does not think he has had any AFib. Tolerates the metoprolol well.  No known CAD in his family Has been told his cholesterol is a little high though not enough to require medications He is former smoker, quit at 49y/o   Past Medical History:  Diagnosis Date  . Allergic rhinitis   . Atrial fibrillation (HCC) 07/2012  . Genital  herpes   . Genital warts   . Lumbar herniated disc   . NSVT (nonsustained ventricular tachycardia) (HCC)   . Spermatocele    right    Past Surgical History:  Procedure Laterality Date  . CARDIOVERSION N/A 08/18/2012   Procedure: CARDIOVERSION;  Surgeon: Duke Salvia, MD;  Location: [facility] ENDOSCOPY;  Service: Cardiovascular;  Laterality: N/A;  . LAPAROSCOPIC APPENDECTOMY N/A 10/14/2015   Procedure: APPENDECTOMY LAPAROSCOPIC;  Surgeon: Berna Bue, MD;  Location: [facility] ORS;  Service: General;  Laterality: N/A;  . SHOULDER ARTHROSCOPY W/ SUPERIOR LABRAL ANTERIOR POSTERIOR LESION REPAIR  2002   right    Current Outpatient Medications  Medication Sig Dispense Refill  . acetaminophen (TYLENOL) 500 MG tablet as needed.     . metoprolol succinate (TOPROL-XL) 25 MG 24 hr tablet TAKE ONE TABLET BY MOUTH ONE TIME DAILY 90 tablet 0  . valACYclovir (VALTREX) 1000 MG tablet as needed.     No current facility-administered medications for this visit.    Allergies:   Patient has no known allergies.   Social History:  The patient  reports that he has quit smoking. He has never used smokeless tobacco. He reports current alcohol use of about 1.0 - 2.0 standard drink of alcohol per week. He reports that he does not use drugs.   Family History:  The patient's family history includes Alzheimer's disease in his maternal grandfather; Alzheimer's disease (age of onset: 89) in his paternal grandfather; Arthritis in his mother; Cancer in his paternal grandmother; Diabetes in his maternal grandmother; Hypertension in his father; Stroke (age of onset: 45) in his father.  ROS:  Please see the history of present illness.  All other systems are reviewed and otherwise negative.   PHYSICAL EXAM:  VS:  BP 120/90   Pulse 71   Ht 6' (1.829 m)   Wt 216 lb (98 kg)   SpO2 97%   BMI 29.29 kg/m  BMI: Body mass index is 29.29 kg/m. Well nourished, well developed, in no acute distress  HEENT: normocephalic, atraumatic  Neck: no JVD, carotid bruits or masses Cardiac:RRR; no significant murmurs, no rubs, or gallops Lungs: CTA b/l, no wheezing, rhonchi or rales  Abd: soft, nontender MS: no deformity oratrophy Ext: no edema  Skin: warm and dry, no rash Neuro:  No gross deficits appreciated Psych: euthymic mood, full affect    EKG:  Done today and reviewed by myself shows  SR 71bpm, nonspecific T changes III, aVF, similar to older EKGs   03/22/14: TTE Study Conclusions - Left ventricle: The cavity size was normal. Wall thickness was increased in a pattern of mild LVH. Systolic function was normal. The estimated ejection fraction was in the range of 60% to 65%. Wall motion was normal; there were no regional wall  motion abnormalities. Left ventricular diastolic function parameters were normal. - Mitral valve: There was mild regurgitation. - Left atrium: The atrium was mildly dilated. - Right atrium: The atrium was mildly dilated.    Recent Labs: No results found for requested labs within last 8760 hours.  No results found for requested labs within last 8760 hours.   CrCl cannot be calculated (Patient's most recent lab result is older than the maximum 21 days allowed.).   Wt Readings from Last 3 Encounters:  01/01/20 216 lb (98 kg)  01/01/19 213 lb (96.6 kg)  01/10/18 219 lb 6.4 oz (99.5 kg)     Other studies reviewed: Additional studies/records reviewed today include: summarized above  ASSESSMENT AND  PLAN:  1. PVCs 2. Paroxysmal AFib     CHA2DS2Vasc is still zero, not on a/c     No symptoms of his AF      continue metoprolol   3. snorning     Says his wife has mentioned she wonders about apnea     Recommend he revisit sleep testing  4. CP     Again, sounds atypical and sounds musculoskeletal     No EKG changes     It is worrisome to him      We will pursue stress testing, disussed the test, potential risks and benefits, he is agreeable to proceed   Disposition: will have him back in 6 weeks to f/u on stress test and symptoms, will request labs from his PMD.  Current medicines are reviewed at length with the patient today.  The patient did not have any concerns regarding medicines.  Norma Fredrickson, PA-C 01/01/2020 9:23 AM     [facility] [address] [place] [address] [phone] (office)  [phone] (fax)

## 2020-01-01 ENCOUNTER — Ambulatory Visit (INDEPENDENT_AMBULATORY_CARE_PROVIDER_SITE_OTHER): Payer: BC Managed Care – PPO | Admitting: Physician Assistant

## 2020-01-01 ENCOUNTER — Encounter: Payer: Self-pay | Admitting: Physician Assistant

## 2020-01-01 ENCOUNTER — Other Ambulatory Visit: Payer: Self-pay

## 2020-01-01 VITALS — BP 120/90 | HR 71 | Ht 72.0 in | Wt 216.0 lb

## 2020-01-01 DIAGNOSIS — I480 Paroxysmal atrial fibrillation: Secondary | ICD-10-CM

## 2020-01-01 DIAGNOSIS — R0789 Other chest pain: Secondary | ICD-10-CM | POA: Diagnosis not present

## 2020-01-01 NOTE — Patient Instructions (Signed)
Medication Instructions:   Your physician recommends that you continue on your current medications as directed. Please refer to the Current Medication list given to you today.   *If you need a refill on your cardiac medications before your next appointment, please call your pharmacy*   Lab Work: NONE ORDERED  TODAY   If you have labs (blood work) drawn today and your tests are completely normal, you will receive your results only by: Marland Kitchen MyChart Message (if you have MyChart) OR . A paper copy in the mail If you have any lab test that is abnormal or we need to change your treatment, we will call you to review the results.   Testing/Procedures: Your physician has requested that you have en exercise stress myoview. For further information please visit [email or web address] Please follow instruction sheet, as given.   Follow-Up: At [facility], you and your health needs are our priority.  As part of our continuing mission to provide you with exceptional heart care, we have created designated Provider Care Teams.  These Care Teams include your primary Cardiologist (physician) and Advanced Practice Providers (APPs -  Physician Assistants and Nurse Practitioners) who all work together to provide you with the care you need, when you need it.  We recommend signing up for the patient portal called "MyChart".  Sign up information is provided on this After Visit Summary.  MyChart is used to connect with patients for Virtual Visits (Telemedicine).  Patients are able to view lab/test results, encounter notes, upcoming appointments, etc.  Non-urgent messages can be sent to your provider as well.   To learn more about what you can do with MyChart, go to [email or web address].    Your next appointment:   6 week(s)  The format for your next appointment:   In Person  Provider:   Francis Dowse, PA-C   Other Instructions

## 2020-01-07 ENCOUNTER — Other Ambulatory Visit (HOSPITAL_COMMUNITY)
Admission: RE | Admit: 2020-01-07 | Discharge: 2020-01-07 | Disposition: A | Discharge status: Home / Self Care | Payer: BC Managed Care – PPO | Source: Ambulatory Visit | Attending: Physician Assistant | Admitting: Physician Assistant

## 2020-01-07 DIAGNOSIS — Z01812 Encounter for preprocedural laboratory examination: Secondary | ICD-10-CM | POA: Diagnosis not present

## 2020-01-07 DIAGNOSIS — Z20822 Contact with and (suspected) exposure to covid-19: Secondary | ICD-10-CM | POA: Diagnosis not present

## 2020-01-07 LAB — SARS CORONAVIRUS 2 (TAT 6-24 HRS): SARS Coronavirus 2: NEGATIVE

## 2020-01-08 ENCOUNTER — Telehealth (HOSPITAL_COMMUNITY): Payer: Self-pay

## 2020-01-08 NOTE — Telephone Encounter (Signed)
Spoke with the patient, detailed instructions were given. He stated that he understood and would be here for his test. Asked to call back with any questions. S.Donielle Kaigler EMTP 

## 2020-01-10 ENCOUNTER — Other Ambulatory Visit: Payer: Self-pay

## 2020-01-10 ENCOUNTER — Ambulatory Visit (HOSPITAL_COMMUNITY): Payer: BC Managed Care – PPO | Attending: Internal Medicine

## 2020-01-10 DIAGNOSIS — R0789 Other chest pain: Secondary | ICD-10-CM | POA: Insufficient documentation

## 2020-01-10 LAB — MYOCARDIAL PERFUSION IMAGING
Estimated workload: 10.1 METS
Exercise duration (min): 9 min
LV dias vol: 101 mL (ref 62–150)
LV sys vol: 41 mL
MPHR: 170 {beats}/min
Peak HR: 150 {beats}/min
Percent HR: 88 %
RPE: 18
Rest HR: 75 {beats}/min
SDS: 0
SRS: 0
SSS: 0
TID: 0.93

## 2020-01-10 MED ORDER — TECHNETIUM TC 99M TETROFOSMIN IV KIT
32.5000 | PACK | Freq: Once | INTRAVENOUS | Status: AC | PRN
  Administered 2020-01-10: 32.5 via INTRAVENOUS
  Filled 2020-01-10: qty 33

## 2020-01-10 MED ORDER — REGADENOSON 0.4 MG/5ML IV SOLN: 0.4000 mg | Freq: Once | INTRAVENOUS | Status: AC

## 2020-01-10 MED ORDER — TECHNETIUM TC 99M TETROFOSMIN IV KIT
10.4000 | PACK | Freq: Once | INTRAVENOUS | Status: AC | PRN
  Administered 2020-01-10: 10.4 via INTRAVENOUS
  Filled 2020-01-10: qty 11

## 2020-02-11 DIAGNOSIS — Z23 Encounter for immunization: Secondary | ICD-10-CM | POA: Diagnosis not present

## 2020-02-11 DIAGNOSIS — Z125 Encounter for screening for malignant neoplasm of prostate: Secondary | ICD-10-CM | POA: Diagnosis not present

## 2020-02-11 DIAGNOSIS — Z13220 Encounter for screening for lipoid disorders: Secondary | ICD-10-CM | POA: Diagnosis not present

## 2020-02-11 DIAGNOSIS — Z0000 Encounter for general adult medical examination without abnormal findings: Secondary | ICD-10-CM | POA: Diagnosis not present

## 2020-02-12 ENCOUNTER — Ambulatory Visit: Payer: BC Managed Care – PPO | Admitting: Physician Assistant

## 2020-03-20 ENCOUNTER — Other Ambulatory Visit: Payer: Self-pay | Admitting: Physician Assistant

## 2020-06-04 DIAGNOSIS — D2271 Melanocytic nevi of right lower limb, including hip: Secondary | ICD-10-CM | POA: Diagnosis not present

## 2020-06-04 DIAGNOSIS — D225 Melanocytic nevi of trunk: Secondary | ICD-10-CM | POA: Diagnosis not present

## 2020-06-04 DIAGNOSIS — D2261 Melanocytic nevi of right upper limb, including shoulder: Secondary | ICD-10-CM | POA: Diagnosis not present

## 2020-06-04 DIAGNOSIS — D2272 Melanocytic nevi of left lower limb, including hip: Secondary | ICD-10-CM | POA: Diagnosis not present

## 2020-08-15 ENCOUNTER — Telehealth: Payer: Self-pay | Admitting: Internal Medicine

## 2020-08-15 NOTE — Telephone Encounter (Signed)
Called pt in regards to palpitations.  He reports that he previously had COVID and associated intermittently lightheadedness.  For the past 2 days or so he has not felt well.  He feels lightheaded and notes when he bends over it becomes worse.  He feels clammy at times.  Pt reports that he has taken extra metoprolol 12.5 mg PO daily for the past 2 days to help with funny feeling in his heart.  BP 8/12- 136/87-63, 8/11- 125/85-63.  He reports that he had a small cup of coffee this morning and drinks water all day.  He says he will not drink any coffee for the next few days to see if it helps symptoms.  He has a glass of wine with dinner but has not had an increase in alcohol consumption.   Denies SOB, arm/jaw pain.  Pt has a history of a fib and NSVT. I reviewed with Otilio Saber, he advices that if pt is not having palpitations to not take any extra metoprolol. I informed pt of provider recommendation.   I informed pt to ensure that he stays hydrated, stay away from caffeine, and reviewed ED precautions.  He verbalizes understanding and will report to the ED if symptoms worsen.

## 2020-08-15 NOTE — Telephone Encounter (Signed)
Patient c/o Palpitations:  High priority if patient c/o lightheadedness, shortness of breath, or chest pain  How long have you had palpitations/irregular HR/ Afib? Are you having the symptoms now? About a month, since having COVID   Are you currently experiencing lightheadedness, SOB or CP? no  Do you have a history of afib (atrial fibrillation) or irregular heart rhythm? yes  Have you checked your BP or HR? (document readings if available):  08/15/20: 136/87 HR 63 08/14/20: 125/85 HR 63  Are you experiencing any other symptoms? Patient just states he doesn't feel normal.   He is not having any classic Cardiac events but just wants to be checked out. Patient states yesterday was the first day he took an additional 1/2 of a metoprolol to manage his symptoms which it helps/

## 2020-08-17 ENCOUNTER — Telehealth: Payer: Self-pay | Admitting: Student

## 2020-08-17 NOTE — Telephone Encounter (Signed)
   The patient called the after-hours line reporting generalized intermittent fatigue since having COVID last month. He has been experiencing more intermittent palpitations as well. He did call the office last week and has an upcoming appointment on 08/22/2020.  He reports this morning he developed some tingling in his left fingertips but denies any associated left arm pain or weakness. No associated chest pain, dyspnea on exertion, slurred speech or blurred vision. I reviewed with the patient that the tingling along his fingertips would be atypical for a cardiac etiology without any associated symptoms. He did check vitals this morning and BP was at 130/80 with heart rate in the 60's. I encouraged him to continue with his regular Toprol-XL 25 mg daily and that he could take an extra 1/2 tablet if needed for palpitations but would not take more than this given his heart rate in the 60's  We reviewed that he should seek emergent evaluation in the interim if he develops any of the above associated symptoms. He voiced understanding of this and was appreciative of the return call  Signed, Ellsworth Lennox, PA-C 08/17/2020, 9:23 AM

## 2020-08-21 NOTE — Progress Notes (Signed)
PCP:  Laurann Montana, MD Primary Cardiologist: None Electrophysiologist: Sherryl Manges, MD   Alejandro Hayes is a 50 y.o. male seen today for Sherryl Manges, MD for acute visit due to palpitations .  Since last being seen in our clinic the patient reports doing OK. He was COVID + approx 5 weeks ago. Had ~ 1 week of flu like > bronchitis type symptoms. Since then, he has noticed decreased exercise capacity and intermittent DOE. He has been concerned that he may be having AF. HRs 60-70s at rest.  he denies chest pain, palpitations, PND, orthopnea, nausea, vomiting, dizziness, syncope, edema, weight gain, or early satiety.  Past Medical History:  Diagnosis Date   Allergic rhinitis    Atrial fibrillation (HCC) 07/2012   Genital herpes    Genital warts    Lumbar herniated disc    NSVT (nonsustained ventricular tachycardia) (HCC)    Spermatocele    right   Past Surgical History:  Procedure Laterality Date   CARDIOVERSION N/A 08/18/2012   Procedure: CARDIOVERSION;  Surgeon: Duke Salvia, MD;  Location: [facility] ENDOSCOPY;  Service: Cardiovascular;  Laterality: N/A;   LAPAROSCOPIC APPENDECTOMY N/A 10/14/2015   Procedure: APPENDECTOMY LAPAROSCOPIC;  Surgeon: Berna Bue, MD;  Location: [facility] ORS;  Service: General;  Laterality: N/A;   SHOULDER ARTHROSCOPY W/ SUPERIOR LABRAL ANTERIOR POSTERIOR LESION REPAIR  2002   right    Current Outpatient Medications  Medication Sig Dispense Refill   acetaminophen (TYLENOL) 500 MG tablet as needed.     metoprolol succinate (TOPROL-XL) 25 MG 24 hr tablet TAKE ONE TABLET BY MOUTH ONE TIME DAILY 90 tablet 2   valACYclovir (VALTREX) 1000 MG tablet as needed.     No current facility-administered medications for this visit.   Facility-Administered Medications Ordered in Other Visits  Medication Dose Route Frequency Provider Last Rate Last Admin   regadenoson (LEXISCAN) injection SOLN 0.4 mg  0.4 mg Intravenous Once Chandrasekhar, Mahesh A, MD        No Known  Allergies  Social History   Socioeconomic History   Marital status: Married    Spouse name: Not on file   Number of children: Not on file   Years of education: Not on file   Highest education level: Not on file  Occupational History   Not on file  Tobacco Use   Smoking status: Former   Smokeless tobacco: Never   Tobacco comments:    quit 20 yrs ago  Substance and Sexual Activity   Alcohol use: Yes    Alcohol/week: 1.0 - 2.0 standard drink    Types: 1 - 2 Glasses of wine per week    Comment: occasional beer   Drug use: No   Sexual activity: Yes  Other Topics Concern   Not on file  Social History Narrative   Tobacco use cigarettes. Former smoker ,Quit in year 1996, Pack -year Hx 2, Tobacco history   Last updated 09/08/2012. Alcohol: one to 2 glasses of beer or wine every other day . Caffeine : 1 cup of decaf coffee daily. No Recreational drug use. Diet low fat, some milk daily. Exercise: 2 times per week, tennis and running. 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 Education: [employer]. Martial Status: married [place]. Children 1 daughter,Scarlett 06/2012. Religion : non practicing. Firearms: yes , practices gun safety. Seatbelt use: always   Social Determinants of Health   Financial Resource Strain: Not on file  Food Insecurity: Not on file  Transportation Needs: Not on file  Physical Activity: Not on file  Stress: Not on file  Social Connections: Not on file  Intimate Partner Violence: Not on file    Review of Systems: All other systems reviewed and are otherwise negative except as noted above.  Physical Exam: Vitals:   08/22/20 0911  BP: 118/68  Pulse: 73  SpO2: 98%  Weight: 213 lb (96.6 kg)  Height: 6' (1.829 m)    GEN- The patient is well appearing, alert and oriented x 3 today.   HEENT: normocephalic, atraumatic; sclera clear, conjunctiva pink; hearing intact; oropharynx clear; neck supple, no JVP Lymph- no  cervical lymphadenopathy Lungs- Clear to ausculation bilaterally, normal work of breathing.  No wheezes, rales, rhonchi Heart- Regular rate and rhythm, no murmurs, rubs or gallops, PMI not laterally displaced GI- soft, non-tender, non-distended, bowel sounds present, no hepatosplenomegaly Extremities- no clubbing, cyanosis, or edema; DP/PT/radial pulses 2+ bilaterally MS- no significant deformity or atrophy Skin- warm and dry, no rash or lesion Psych- euthymic mood, full affect Neuro- strength and sensation are intact  EKG is ordered. Personal review of EKG from today shows NSR at 73 bpm  Additional studies reviewed include: Previous EP office notes.   Assessment and Plan:  1. PVCs 2. Paroxysmal AFib CHA2DS2Vasc is still zero, not on a/c Increasing palpitations since recent COVID diagnosis.  Continue metoprolol 25 mg daily. Didn't feel a difference taking an extra half of toprol Will order 7 day  Zio to watch for "silent" for his periods of fatigue.  EF was normal by stress test 01/2020. Low threshold to update complete echo if symptoms do not improve.  Labs today.     3. Sleep disorder breathing Snores, and his wife has mentioned she wonders about apnea He agrees with proceed with sleep study. Stop Bang below.   4. CP, atypical He had a normal 01/10/2020. We discussed at length and I do not think there is an indication for retesting at this time. Discussed alarm symptoms.   Graciella Freer, PA-C  08/22/20 9:25 AM  STOP BANG RISK ASSESSMENT  S(snore) Have you been told that your snore?  T(tired) Are you often tired, fatigued, or sleepy during the day? O(obstruction) Do you stop breathing, choke or gasp during sleep? P(pressure) Do you have or are you being treated for high blood pressure? B(BMI) Is your body index greater than 35 kg/m? Body mass index is 28.89 kg/m.  A(age) Are you 49 years old or older? 50 y.o. N(neck) Do you have a neck circumference greater than  40 cm? G(gender) Are you a male? male  Clinical Notes: Will consult Sleep Specialist and refer for management of therapy due to patient increased risk of Sleep Apnea. Ordering a sleep study due to the following two clinical symptoms: Excessive daytime sleepiness G47.10 / Gastroesphageal reflux K21.9 / Nocturia R35.1 / Morning Headaches G44.21 / Difficulty concentrating R41.840 / Memory problems or poor judgement G31.84 / Personality changes or irritability R45.4 / Loud snoring R06.83 / Depression F32.9 / Unrefreshed by sleep G47.8 / Impotence N52.9 / History of high blood pressure R03.0 / Insomnia G47.00

## 2020-08-22 ENCOUNTER — Ambulatory Visit (INDEPENDENT_AMBULATORY_CARE_PROVIDER_SITE_OTHER): Payer: BC Managed Care – PPO

## 2020-08-22 ENCOUNTER — Encounter: Payer: Self-pay | Admitting: Student

## 2020-08-22 ENCOUNTER — Ambulatory Visit (INDEPENDENT_AMBULATORY_CARE_PROVIDER_SITE_OTHER): Payer: BC Managed Care – PPO | Admitting: Student

## 2020-08-22 ENCOUNTER — Other Ambulatory Visit: Payer: Self-pay

## 2020-08-22 VITALS — BP 118/68 | HR 73 | Ht 72.0 in | Wt 213.0 lb

## 2020-08-22 DIAGNOSIS — R0789 Other chest pain: Secondary | ICD-10-CM

## 2020-08-22 DIAGNOSIS — I480 Paroxysmal atrial fibrillation: Secondary | ICD-10-CM

## 2020-08-22 DIAGNOSIS — R002 Palpitations: Secondary | ICD-10-CM | POA: Diagnosis not present

## 2020-08-22 DIAGNOSIS — G4730 Sleep apnea, unspecified: Secondary | ICD-10-CM

## 2020-08-22 NOTE — Progress Notes (Unsigned)
Enrolled patient for a 7 day Zio XT monitor to be mailed to patients home   Dr Klein to read 

## 2020-08-22 NOTE — Addendum Note (Signed)
Addended by: Bernell List on: 08/22/2020 04:15 PM   Modules accepted: Orders

## 2020-08-22 NOTE — Patient Instructions (Signed)
Medication Instructions:  Your physician recommends that you continue on your current medications as directed. Please refer to the Current Medication list given to you today.  *If you need a refill on your cardiac medications before your next appointment, please call your pharmacy*   Lab Work: TODAY: CMET, CBC  If you have labs (blood work) drawn today and your tests are completely normal, you will receive your results only by: MyChart Message (if you have MyChart) OR A paper copy in the mail If you have any lab test that is abnormal or we need to change your treatment, we will call you to review the results.   Follow-Up: At [facility], you and your health needs are our priority.  As part of our continuing mission to provide you with exceptional heart care, we have created designated Provider Care Teams.  These Care Teams include your primary Cardiologist (physician) and Advanced Practice Providers (APPs -  Physician Assistants and Nurse Practitioners) who all work together to provide you with the care you need, when you need it.   Your next appointment:   11/25/2020 with Otilio Saber, PA_C  Other Instructions ZIO XT- Long Term Monitor Instructions  Your physician has requested you wear a ZIO patch monitor for 7 days.  This is a single patch monitor. [employer] supplies one patch monitor per enrollment. Additional stickers are not available. Please do not apply patch if you will be having a Nuclear Stress Test,  Echocardiogram, Cardiac CT, MRI, or Chest Xray during the period you would be wearing the  monitor. The patch cannot be worn during these tests. You cannot remove and re-apply the  ZIO XT patch monitor.  Your ZIO patch monitor will be mailed 3 day USPS to your address on file. It may take 3-5 days  to receive your monitor after you have been enrolled.  Once you have received your monitor, please review the enclosed instructions. Your monitor  has already been registered  assigning a specific monitor serial # to you.  Billing and Patient Assistance Program Information  We have supplied [employer] with any of your insurance information on file for billing purposes. [employer] offers a sliding scale Patient Assistance Program for patients that do not have  insurance, or whose insurance does not completely cover the cost of the ZIO monitor.  You must apply for the Patient Assistance Program to qualify for this discounted rate.  To apply, please call [employer] at [phone], select option 4, select option 2, ask to apply for  Patient Assistance Program. Meredeth Ide will ask your household income, and how many people  are in your household. They will quote your out-of-pocket cost based on that information.  [employer] will also be able to set up a 39-month, interest-free payment plan if needed.  Applying the monitor   Shave hair from upper left chest.  Hold abrader disc by orange tab. Rub abrader in 40 strokes over the upper left chest as  indicated in your monitor instructions.  Clean area with 4 enclosed alcohol pads. Let dry.  Apply patch as indicated in monitor instructions. Patch will be placed under collarbone on left  side of chest with arrow pointing upward.  Rub patch adhesive wings for 2 minutes. Remove white label marked "1". Remove the white  label marked "2". Rub patch adhesive wings for 2 additional minutes.  While looking in a mirror, press and release button in center of patch. A small green light will  flash 3-4 times. This will be  your only indicator that the monitor has been turned on.  Do not shower for the first 24 hours. You may shower after the first 24 hours.  Press the button if you feel a symptom. You will hear a small click. Record Date, Time and  Symptom in the Patient Logbook.  When you are ready to remove the patch, follow instructions on the last 2 pages of Patient  Logbook. Stick patch monitor onto the last page of Patient Logbook.  Place  Patient Logbook in the blue and white box. Use locking tab on box and tape box closed  securely. The blue and white box has prepaid postage on it. Please place it in the mailbox as  soon as possible. Your physician should have your test results approximately 7 days after the  monitor has been mailed back to [facility].  Call [facility] Customer Care at [phone] if you have questions regarding  your ZIO XT patch monitor. Call them immediately if you see an orange light blinking on your  monitor.  If your monitor falls off in less than 4 days, contact our Monitor department at [phone].  If your monitor becomes loose or falls off after 4 days call [employer] at [phone] for  suggestions on securing your monitor

## 2020-08-23 LAB — COMPREHENSIVE METABOLIC PANEL
ALT: 20 IU/L (ref 0–44)
AST: 19 IU/L (ref 0–40)
Albumin/Globulin Ratio: 2 (ref 1.2–2.2)
Albumin: 4.9 g/dL (ref 4.0–5.0)
Alkaline Phosphatase: 73 IU/L (ref 44–121)
BUN/Creatinine Ratio: 11 (ref 9–20)
BUN: 12 mg/dL (ref 6–24)
Bilirubin Total: 0.8 mg/dL (ref 0.0–1.2)
CO2: 26 mmol/L (ref 20–29)
Calcium: 9.4 mg/dL (ref 8.7–10.2)
Chloride: 99 mmol/L (ref 96–106)
Creatinine, Ser: 1.12 mg/dL (ref 0.76–1.27)
Globulin, Total: 2.5 g/dL (ref 1.5–4.5)
Glucose: 87 mg/dL (ref 65–99)
Potassium: 4.5 mmol/L (ref 3.5–5.2)
Sodium: 139 mmol/L (ref 134–144)
Total Protein: 7.4 g/dL (ref 6.0–8.5)
eGFR: 80 mL/min/{1.73_m2} (ref >59)

## 2020-08-23 LAB — CBC
Hematocrit: 49.4 % (ref 37.5–51.0)
Hemoglobin: 17.4 g/dL (ref 13.0–17.7)
MCH: 31.6 pg (ref 26.6–33.0)
MCHC: 35.2 g/dL (ref 31.5–35.7)
MCV: 90 fL (ref 79–97)
Platelets: 193 10*3/uL (ref 150–450)
RBC: 5.51 x10E6/uL (ref 4.14–5.80)
RDW: 12.7 % (ref 11.6–15.4)
WBC: 5.8 10*3/uL (ref 3.4–10.8)

## 2020-08-23 LAB — TSH: TSH: 1.15 u[IU]/mL (ref 0.450–4.500)

## 2020-08-27 DIAGNOSIS — I480 Paroxysmal atrial fibrillation: Secondary | ICD-10-CM | POA: Diagnosis not present

## 2020-08-27 DIAGNOSIS — R000 Tachycardia, unspecified: Secondary | ICD-10-CM

## 2020-09-05 ENCOUNTER — Telehealth: Payer: BC Managed Care – PPO | Admitting: *Deleted

## 2020-09-05 ENCOUNTER — Encounter: Payer: Self-pay | Admitting: *Deleted

## 2020-09-05 DIAGNOSIS — G4730 Sleep apnea, unspecified: Secondary | ICD-10-CM

## 2020-09-05 NOTE — Telephone Encounter (Signed)
-----   Message from Wanda M Waddell, CMA sent at 08/25/2020 11:05 AM EDT ----- Regarding: FW: [place]  ----- Message ----- From: Garrison, April, CMA Sent: 08/22/2020   9:57 AM EDT To: Cv Div Sleep Studies Subject: [place]                                         Please Pre-cert for [facility] Sleep Study  Thanks April Garrison   

## 2020-09-05 NOTE — Telephone Encounter (Signed)
Prior Authorization for [facility] sent to [facility] via web portal.  ORDER #[id number]  Anticipated Determination Date:09/10/2020

## 2020-09-05 NOTE — Telephone Encounter (Signed)
-----   Message from Gaynelle Cage, [place] sent at 08/25/2020 11:05 AM EDT ----- Regarding: FW: Itimar  ----- Message ----- From: Cleda Mccreedy, CMA Sent: 08/22/2020   9:57 AM EDT To: [facility] Sleep Studies Subject: Filomena Jungling                                         Please Pre-cert for [employer] Sleep Study  Thanks April Rinaldo Ratel

## 2020-09-10 DIAGNOSIS — I480 Paroxysmal atrial fibrillation: Secondary | ICD-10-CM | POA: Diagnosis not present

## 2020-09-12 NOTE — Telephone Encounter (Signed)
Home Sleep Test DENIED not medically necessary. In lab test suggested.

## 2020-09-12 NOTE — Telephone Encounter (Signed)
This encounter was created in error - please disregard.

## 2020-09-29 NOTE — Telephone Encounter (Signed)
Prior Authorization for split night sent to [facility] via web portal. Tracking Number [id number]. Valid through 09/29/2020-11/27/2020. Please schedule patient.

## 2020-10-15 NOTE — Telephone Encounter (Signed)
Patient is scheduled for lab study on 11/17/20. Patient understands his sleep study will be done at [facility] sleep lab. Patient understands he will receive a sleep packet in a week or so. Patient understands to call if he does not receive the sleep packet in a timely manner. Left detailed message on voicemail with date and time  and informed patient to call back to confirm or reschedule.

## 2020-11-12 DIAGNOSIS — Z23 Encounter for immunization: Secondary | ICD-10-CM | POA: Diagnosis not present

## 2020-11-17 ENCOUNTER — Ambulatory Visit (HOSPITAL_BASED_OUTPATIENT_CLINIC_OR_DEPARTMENT_OTHER): Payer: BC Managed Care – PPO | Attending: Student | Admitting: Cardiology

## 2020-11-17 ENCOUNTER — Other Ambulatory Visit: Payer: Self-pay

## 2020-11-17 DIAGNOSIS — G4733 Obstructive sleep apnea (adult) (pediatric): Secondary | ICD-10-CM | POA: Insufficient documentation

## 2020-11-17 DIAGNOSIS — I480 Paroxysmal atrial fibrillation: Secondary | ICD-10-CM

## 2020-11-17 DIAGNOSIS — G4730 Sleep apnea, unspecified: Secondary | ICD-10-CM

## 2020-11-24 NOTE — Progress Notes (Signed)
PCP:  Laurann Montana, MD Primary Cardiologist: None Electrophysiologist: Sherryl Manges, MD   Alejandro Hayes is a 50 y.o. male seen today for Sherryl Manges, MD for routine electrophysiology followup.  Since last being seen in our clinic the patient reports doing very well. He has not had any further episodes of palpitation or lightheadedness since wearing his monitor.  he denies chest pain, palpitations, dyspnea, PND, orthopnea, nausea, vomiting, dizziness, syncope, edema, weight gain, or early satiety.  Past Medical History:  Diagnosis Date   Allergic rhinitis    Atrial fibrillation (HCC) 07/2012   Genital herpes    Genital warts    Lumbar herniated disc    NSVT (nonsustained ventricular tachycardia) (HCC)    Spermatocele    right   Past Surgical History:  Procedure Laterality Date   CARDIOVERSION N/A 08/18/2012   Procedure: CARDIOVERSION;  Surgeon: Duke Salvia, MD;  Location: [facility] ENDOSCOPY;  Service: Cardiovascular;  Laterality: N/A;   LAPAROSCOPIC APPENDECTOMY N/A 10/14/2015   Procedure: APPENDECTOMY LAPAROSCOPIC;  Surgeon: Berna Bue, MD;  Location: [facility] ORS;  Service: General;  Laterality: N/A;   SHOULDER ARTHROSCOPY W/ SUPERIOR LABRAL ANTERIOR POSTERIOR LESION REPAIR  2002   right    Current Outpatient Medications  Medication Sig Dispense Refill   acetaminophen (TYLENOL) 500 MG tablet as needed.     metoprolol succinate (TOPROL-XL) 25 MG 24 hr tablet TAKE ONE TABLET BY MOUTH ONE TIME DAILY 90 tablet 2   valACYclovir (VALTREX) 1000 MG tablet as needed.     No current facility-administered medications for this visit.   Facility-Administered Medications Ordered in Other Visits  Medication Dose Route Frequency Provider Last Rate Last Admin   regadenoson (LEXISCAN) injection SOLN 0.4 mg  0.4 mg Intravenous Once Chandrasekhar, Mahesh A, MD        No Known Allergies  Social History   Socioeconomic History   Marital status: Married    Spouse name: Not on file    Number of children: Not on file   Years of education: Not on file   Highest education level: Not on file  Occupational History   Not on file  Tobacco Use   Smoking status: Former   Smokeless tobacco: Never   Tobacco comments:    quit 20 yrs ago  Substance and Sexual Activity   Alcohol use: Yes    Alcohol/week: 1.0 - 2.0 standard drink    Types: 1 - 2 Glasses of wine per week    Comment: occasional beer   Drug use: No   Sexual activity: Yes  Other Topics Concern   Not on file  Social History Narrative   Tobacco use cigarettes. Former smoker ,Quit in year 1996, Pack -year Hx 2, Tobacco history   Last updated 09/08/2012. Alcohol: one to 2 glasses of beer or wine every other day . Caffeine : 1 cup of decaf coffee daily. No Recreational drug use. Diet low fat, some milk daily. Exercise: 2 times per week, tennis and running. 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 Education: [employer]. Martial Status: married [place]. Children 1 daughter,Scarlett 06/2012. Religion : non practicing. Firearms: yes , practices gun safety. Seatbelt use: always   Social Determinants of Health   Financial Resource Strain: Not on file  Food Insecurity: Not on file  Transportation Needs: Not on file  Physical Activity: Not on file  Stress: Not on file  Social Connections: Not on file  Intimate Partner Violence: Not on file  Review of Systems: All other systems reviewed and are otherwise negative except as noted above.  Physical Exam: There were no vitals filed for this visit.  GEN- The patient is well appearing, alert and oriented x 3 today.   HEENT: normocephalic, atraumatic; sclera clear, conjunctiva pink; hearing intact; oropharynx clear; neck supple, no JVP Lymph- no cervical lymphadenopathy Lungs- Clear to ausculation bilaterally, normal work of breathing.  No wheezes, rales, rhonchi Heart- Regular rate and rhythm, no murmurs, rubs or gallops,  PMI not laterally displaced GI- soft, non-tender, non-distended, bowel sounds present, no hepatosplenomegaly Extremities- no clubbing, cyanosis, or edema; DP/PT/radial pulses 2+ bilaterally MS- no significant deformity or atrophy Skin- warm and dry, no rash or lesion Psych- euthymic mood, full affect Neuro- strength and sensation are intact  EKG is not ordered.   Additional studies reviewed include: Previous EP office notes.   Zio 09/2020 Patient had a min HR of 40 bpm, max HR of 156 bpm, and avg HR of 68 bpm.    Predominant underlying rhythm was Sinus Rhythm. 2 Ventricular Tachycardia runs occurred, the run with the fastest interval lasting 10 beats with a max rate of 156 bpm (avg 150 bpm); the run with the fastest interval was also the longest.  2 Supraventricular Tachycardia runs occurred, the run with the fastest interval lasting 6 beats with a max rate of 156 bpm, the longest lasting 8 beats with an avg rate of 108 bpm. Supraventricular  Tachycardia was detected within +/- 45 seconds of symptomatic patient event(s). Isolated SVEs were rare (<1.0%), SVE Couplets were rare (<1.0%), and SVE Triplets were rare (<1.0%).  Isolated VEs were rare (<1.0%, 6235), VE Couplets were rare (<1.0%, 92), and VE Triplets were rare (<1.0%, 11). Ventricular Trigeminy was present.  Pt Diary events LH >> assoc with NSR  Fluttering/LH   assoc with NSR   Pt triggered events 1) sinus  2) nonsustained atrial tachycardia 6 beats  Assessment and Plan:  1. PVCs 2. Paroxysmal AFib CHA2DS2Vasc is still zero, not on a/c Increasing palpitations since recent COVID diagnosis, that have now tapered off. Monitor showed only 2 NSVT and 2 short SVT. Symptoms were also associated with NSR.  Continue metoprolol 25 mg daily.  EF was normal by stress test 01/2020. Low threshold to update complete echo if symptoms do not improve.   3. Sleep disorder breathing Sleep study completed, pending results.  He agrees with  proceed with sleep study. Stop Bang below.   4. CP, atypical He had a normal stress 01/10/2020. We discussed at length and I do not think there is an indication for retesting at this time. Discussed alarm symptoms. Stable.  Follow up with Dr. Caryl Comes in 12 months, sooner with issues.   Shirley Friar, PA-C  11/24/20 10:00 AM

## 2020-11-25 ENCOUNTER — Ambulatory Visit (INDEPENDENT_AMBULATORY_CARE_PROVIDER_SITE_OTHER): Payer: BC Managed Care – PPO | Admitting: Student

## 2020-11-25 ENCOUNTER — Other Ambulatory Visit: Payer: Self-pay

## 2020-11-25 ENCOUNTER — Encounter: Payer: Self-pay | Admitting: Student

## 2020-11-25 VITALS — BP 124/78 | HR 87 | Ht 72.0 in | Wt 212.0 lb

## 2020-11-25 DIAGNOSIS — R0789 Other chest pain: Secondary | ICD-10-CM

## 2020-11-25 DIAGNOSIS — I480 Paroxysmal atrial fibrillation: Secondary | ICD-10-CM

## 2020-11-25 DIAGNOSIS — R002 Palpitations: Secondary | ICD-10-CM

## 2020-11-25 DIAGNOSIS — G4730 Sleep apnea, unspecified: Secondary | ICD-10-CM | POA: Diagnosis not present

## 2020-11-25 NOTE — Patient Instructions (Signed)
Medication Instructions:  Your physician recommends that you continue on your current medications as directed. Please refer to the Current Medication list given to you today.  *If you need a refill on your cardiac medications before your next appointment, please call your pharmacy*   Lab Work: None If you have labs (blood work) drawn today and your tests are completely normal, you will receive your results only by: MyChart Message (if you have MyChart) OR A paper copy in the mail If you have any lab test that is abnormal or we need to change your treatment, we will call you to review the results.   Follow-Up: At [facility], you and your health needs are our priority.  As part of our continuing mission to provide you with exceptional heart care, we have created designated Provider Care Teams.  These Care Teams include your primary Cardiologist (physician) and Advanced Practice Providers (APPs -  Physician Assistants and Nurse Practitioners) who all work together to provide you with the care you need, when you need it.   Your next appointment:   1 year(s)  The format for your next appointment:   In Person  Provider:   You may see Steven Klein, MD or one of the following Advanced Practice Providers on your designated Care Team:   Renee Ursuy, PA-C Michael "Andy" Tillery, PA-C    

## 2020-12-01 NOTE — Procedures (Signed)
   Patient Name: Alejandro Hayes, Alejandro Hayes Date:11/17/2020 Gender: Male D.O.B: 07-12-1970 Age (years): 49 Referring Provider: Maxine Glenn PA-C Height (inches): 73 Interpreting Physician: Armanda Magic MD, ABSM Weight (lbs): 210 RPSGT: Rosette Reveal BMI: 28 MRN: [id number] Neck Size: 16.00  CLINICAL INFORMATION Sleep Study Type: NPSG  Indication for sleep study: N/A  Epworth Sleepiness Score: 3  SLEEP STUDY TECHNIQUE As per the AASM Manual for the Scoring of Sleep and Associated Events v2.3 (April 2016) with a hypopnea requiring 4% desaturations.  The channels recorded and monitored were frontal, central and occipital EEG, electrooculogram (EOG), submentalis EMG (chin), nasal and oral airflow, thoracic and abdominal wall motion, anterior tibialis EMG, snore microphone, electrocardiogram, and pulse oximetry.  MEDICATIONS Medications self-administered by patient taken the night of the study : N/A  SLEEP ARCHITECTURE The study was initiated at 10:59:38 PM and ended at 5:07:22 AM.  Sleep onset time was 29.0 minutes and the sleep efficiency was 68.4%. The total sleep time was 251.5 minutes.  Stage REM latency was 65.0 minutes.  The patient spent 5.8% of the night in stage N1 sleep, 68.4% in stage N2 sleep, 0.0% in stage N3 and 25.8% in REM.  Alpha intrusion was absent.  Supine sleep was 38.57%.  RESPIRATORY PARAMETERS The overall apnea/hypopnea index (AHI) was 4.3 per hour. There were 0 total apneas, including 0 obstructive, 0 central and 0 mixed apneas. There were 18 hypopneas and 6 RERAs.  The AHI during Stage REM sleep was 12.9 per hour.  AHI while supine was 10.5 per hour.  The mean oxygen saturation was 94.1%. The minimum SpO2 during sleep was 76.0%.  loud snoring was noted during this study.  CARDIAC DATA The 2 lead EKG demonstrated sinus rhythm. The mean heart rate was 56.5 beats per minute. Other EKG findings include: PVCs.  LEG MOVEMENT DATA The total PLMS  were 0 with a resulting PLMS index of 0.0. Associated arousal with leg movement index was 1.4 .  IMPRESSIONS - No significant obstructive sleep apnea occurred during this study (AHI = 4.3/h). - Moderate oxygen desaturation was noted during this study (Min O2 = 76.0%). - The patient snored with loud snoring volume. - PVCs were noted during this study. - Clinically significant periodic limb movements did not occur during sleep. No significant associated arousals.  DIAGNOSIS - Normal Study  RECOMMENDATIONS - Positional therapy avoiding supine position during sleep. - Avoid alcohol, sedatives and other CNS depressants that may worsen sleep apnea and disrupt normal sleep architecture. - Sleep hygiene should be reviewed to assess factors that may improve sleep quality. - Weight management and regular exercise should be initiated or continued if appropriate.  [Electronically signed] 12/01/2020 07:50 AM  Armanda Magic MD, ABSM Diplomate, American Board of Sleep Medicine

## 2020-12-06 ENCOUNTER — Telehealth: Payer: Self-pay | Admitting: *Deleted

## 2020-12-06 NOTE — Telephone Encounter (Signed)
The patient has been notified of the result and verbalized understanding.  All questions (if any) were answered. Latrelle Dodrill, CMA 12/06/2020 3:54 PM

## 2020-12-06 NOTE — Telephone Encounter (Signed)
-----   Message from Quintella Reichert, MD sent at 12/01/2020  7:52 AM EST ----- Please let patient know that sleep study showed no significant sleep apnea.

## 2020-12-25 ENCOUNTER — Other Ambulatory Visit: Payer: Self-pay | Admitting: Internal Medicine

## 2021-02-24 DIAGNOSIS — L570 Actinic keratosis: Secondary | ICD-10-CM | POA: Diagnosis not present

## 2021-02-25 DIAGNOSIS — Z125 Encounter for screening for malignant neoplasm of prostate: Secondary | ICD-10-CM | POA: Diagnosis not present

## 2021-02-25 DIAGNOSIS — R3912 Poor urinary stream: Secondary | ICD-10-CM | POA: Diagnosis not present

## 2021-02-25 DIAGNOSIS — I480 Paroxysmal atrial fibrillation: Secondary | ICD-10-CM | POA: Diagnosis not present

## 2021-02-25 DIAGNOSIS — R351 Nocturia: Secondary | ICD-10-CM | POA: Diagnosis not present

## 2021-02-25 DIAGNOSIS — Z0000 Encounter for general adult medical examination without abnormal findings: Secondary | ICD-10-CM | POA: Diagnosis not present

## 2021-02-25 DIAGNOSIS — Z13220 Encounter for screening for lipoid disorders: Secondary | ICD-10-CM | POA: Diagnosis not present

## 2021-02-25 DIAGNOSIS — A609 Anogenital herpesviral infection, unspecified: Secondary | ICD-10-CM | POA: Diagnosis not present

## 2021-05-25 DIAGNOSIS — M94261 Chondromalacia, right knee: Secondary | ICD-10-CM | POA: Diagnosis not present

## 2021-05-25 DIAGNOSIS — M25561 Pain in right knee: Secondary | ICD-10-CM | POA: Diagnosis not present

## 2021-07-03 DIAGNOSIS — D2261 Melanocytic nevi of right upper limb, including shoulder: Secondary | ICD-10-CM | POA: Diagnosis not present

## 2021-07-03 DIAGNOSIS — L918 Other hypertrophic disorders of the skin: Secondary | ICD-10-CM | POA: Diagnosis not present

## 2021-07-03 DIAGNOSIS — L738 Other specified follicular disorders: Secondary | ICD-10-CM | POA: Diagnosis not present

## 2021-07-03 DIAGNOSIS — D224 Melanocytic nevi of scalp and neck: Secondary | ICD-10-CM | POA: Diagnosis not present

## 2022-01-07 NOTE — Progress Notes (Signed)
Cardiology Office Note Date:  01/08/2022  Patient ID:  Alejandro Hayes, Alejandro Hayes February 04, 1970, MRN [id number] PCP:  Harlan Stains, MD  Cardiologist:  None Electrophysiologist: Virl Axe, MD  Chief Complaint: palpitation  History of Present Illness: Alejandro Hayes is a 52 y.o. male with PMH notable for PVC, parox AFib; seen today for Virl Axe, MD for routine electrophysiology followup.   Last saw PA Tillery 11/2020, no episodes of palps or lightheadness.  Since last being seen in our clinic the patient reports doing very well. He has not had any episodes of palpitation since last being seen.  He continues to take metop daily, though does sometimes forget. Discussed pill box or using phone alarm as reminder.   he denies chest pain, palpitations, dyspnea, PND, orthopnea, nausea, vomiting, dizziness, syncope, edema, weight gain, or early satiety.     AFib History: Diag 07/2012 > DCCV ?2017 - increased palps w ETOH on vacation, resolved  Past Medical History:  Diagnosis Date   Allergic rhinitis    Atrial fibrillation ([place]) 07/2012   Genital herpes    Genital warts    Lumbar herniated disc    NSVT (nonsustained ventricular tachycardia) ([place])    Spermatocele    right    Past Surgical History:  Procedure Laterality Date   CARDIOVERSION N/A 08/18/2012   Procedure: CARDIOVERSION;  Surgeon: Deboraha Sprang, MD;  Location: [facility];  Service: Cardiovascular;  Laterality: N/A;   LAPAROSCOPIC APPENDECTOMY N/A 10/14/2015   Procedure: APPENDECTOMY LAPAROSCOPIC;  Surgeon: Clovis Riley, MD;  Location: [facility];  Service: General;  Laterality: N/A;   SHOULDER ARTHROSCOPY W/ SUPERIOR LABRAL ANTERIOR POSTERIOR LESION REPAIR  2002   right    Current Outpatient Medications  Medication Sig Dispense Refill   acetaminophen (TYLENOL) 500 MG tablet as needed.     metoprolol succinate (TOPROL-XL) 25 MG 24 hr tablet TAKE ONE TABLET BY MOUTH ONE TIME DAILY 90 tablet 3   valACYclovir  (VALTREX) 1000 MG tablet as needed.     No current facility-administered medications for this visit.   Facility-Administered Medications Ordered in Other Visits  Medication Dose Route Frequency Provider Last Rate Last Admin   regadenoson (LEXISCAN) injection SOLN 0.4 mg  0.4 mg Intravenous Once Chandrasekhar, Mahesh A, MD        Allergies:   Patient has no known allergies.   Social History:  The patient  reports that he has quit smoking. He has never used smokeless tobacco. He reports current alcohol use of about 1.0 - 2.0 standard drink of alcohol per week. He reports that he does not use drugs.   Family History:  The patient's family history includes Alzheimer's disease in his maternal grandfather; Alzheimer's disease (age of onset: 56) in his paternal grandfather; Arthritis in his mother; Cancer in his paternal grandmother; Diabetes in his maternal grandmother; Hypertension in his father; Stroke (age of onset: 72) in his father.  ROS:  Please see the history of present illness. All other systems are reviewed and otherwise negative.   PHYSICAL EXAM:  VS:  BP 114/76   Pulse 70   Ht 6' (1.829 m)   Wt 224 lb (101.6 kg)   SpO2 97%   BMI 30.38 kg/m  BMI: Body mass index is 30.38 kg/m.  GEN- The patient is well appearing, alert and oriented x 3 today.   HEENT: normocephalic, atraumatic; sclera clear, conjunctiva pink; hearing intact; oropharynx clear; neck supple, no JVP Lungs- Clear to ausculation bilaterally, normal work of breathing.  No wheezes, rales, rhonchi Heart- Regular rate and rhythm, no murmurs, rubs or gallops, PMI not laterally displaced GI- soft, non-tender, non-distended, bowel sounds present, no hepatosplenomegaly Extremities- No peripheral edema. no clubbing or cyanosis; DP/PT/radial pulses 2+ bilaterally MS- no significant deformity or atrophy Skin- warm and dry, no rash or lesion Psych- euthymic mood, full affect Neuro- strength and sensation are intact   EKG is  ordered. Personal review of EKG from today shows:  NSR, rate 71bpm  Recent Labs: No results found for requested labs within last 365 days.  No results found for requested labs within last 365 days.   CrCl cannot be calculated (Patient's most recent lab result is older than the maximum 21 days allowed.).   Wt Readings from Last 3 Encounters:  01/08/22 224 lb (101.6 kg)  11/25/20 212 lb (96.2 kg)  08/22/20 213 lb (96.6 kg)     Additional studies reviewed include: Previous EP notes.   Zio - wore 1 week - 08/22/2020 Predominant underlying rhythm was Sinus Rhythm. 2 Ventricular Tachycardia runs occurred, the run with the fastest interval lasting 10 beats with a max rate of 156 bpm (avg 150 bpm); the run with the fastest interval was also the longest.  2 Supraventricular Tachycardia runs occurred, the run with the fastest interval lasting 6 beats with a max rate of 156 bpm, the longest lasting 8 beats with an avg rate of 108 bpm. Supraventricular Tachycardia was detected within +/- 45 seconds of symptomatic patient event(s). Isolated SVEs were rare (<1.0%), SVE Couplets were rare (<1.0%), and SVE Triplets were rare (<1.0%). Isolated VEs were rare (<1.0%, 6235), VE Couplets were rare (<1.0%, 92), and VE Triplets were rare (<1.0%, 11). Ventricular Trigeminy was present.  Pt triggered events 1) sinus  2) nonsustained atrial tachycardia 6 beats Conclusions Most of symptoms were assoc with normal sinus rhythm  On one occasion a short run of atrial tach  Not sure if the finding is specific as most of his triggers were with normal rhythm  Spect Myo perf w exercise 01/10/2020 Nuclear stress EF: 60%. Blood pressure demonstrated a hypertensive response to exercise. Horizontal ST segment depression ST segment depression of 2 mm was noted during stress in the III, II, aVF, V5 and V6 leads, beginning at 8 minutes of stress, ending within 1 minutes of stress, and returning to baseline after less than 1  minute of recovery. Defect 1: There is a small defect present in the mid anteroseptal location; a small (< 10%) resting defect, that improves with stress consistent with artifact. The study is normal. This is a low risk study. The left ventricular ejection fraction is normal (55-65%).  Positive ECG Stress test, but with negative nuclear medicine stress test for ischemia or infarction.  Normal wall motion; with no evidence of balanced ischemia.    ASSESSMENT AND PLAN:  #) parox Afib No symptoms currently  CHA2DS2-VASc Score = 0 [CHF History: 0, HTN History: 0, Diabetes History: 0, Stroke History: 0, Vascular Disease History: 0, Age Score: 0, Gender Score: 0].  Therefore, the patient's annual risk of stroke is 0.2 %. Not on [facility]   #) NSVT No symptoms Continue metop admits to missing doses every now and then Discussed ways to increase medication compliance - pill box, alarm on phone   Current medicines are reviewed at length with the patient today.   The patient does not have concerns regarding his medicines.  The following changes were made today:  none  Labs/ tests ordered today  include:  Orders Placed This Encounter  Procedures   EKG 12-Lead     Disposition: Follow up with Dr. Caryl Comes in 12 months   Signed, Mamie Levers, NP  01/08/22 4:46 PM   [address] [place] [address] [phone] (office)  [phone] (fax)

## 2022-01-08 ENCOUNTER — Ambulatory Visit: Payer: BC Managed Care – PPO | Attending: Physician Assistant | Admitting: Cardiology

## 2022-01-08 ENCOUNTER — Encounter: Payer: Self-pay | Admitting: Physician Assistant

## 2022-01-08 VITALS — BP 114/76 | HR 70 | Ht 72.0 in | Wt 224.0 lb

## 2022-01-08 DIAGNOSIS — I480 Paroxysmal atrial fibrillation: Secondary | ICD-10-CM | POA: Diagnosis not present

## 2022-01-08 DIAGNOSIS — I4729 Other ventricular tachycardia: Secondary | ICD-10-CM | POA: Diagnosis not present

## 2022-01-08 NOTE — Patient Instructions (Signed)
Medication Instructions:    Your physician recommends that you continue on your current medications as directed. Please refer to the Current Medication list given to you today.   *If you need a refill on your cardiac medications before your next appointment, please call your pharmacy*   Lab Work: NONE ORDERED  TODAY    If you have labs (blood work) drawn today and your tests are completely normal, you will receive your results only by: MyChart Message (if you have MyChart) OR A paper copy in the mail If you have any lab test that is abnormal or we need to change your treatment, we will call you to review the results.   Testing/Procedures: NONE ORDERED  TODAY    Follow-Up: At [facility], you and your health needs are our priority.  As part of our continuing mission to provide you with exceptional heart care, we have created designated Provider Care Teams.  These Care Teams include your primary Cardiologist (physician) and Advanced Practice Providers (APPs -  Physician Assistants and Nurse Practitioners) who all work together to provide you with the care you need, when you need it.  We recommend signing up for the patient portal called "MyChart".  Sign up information is provided on this After Visit Summary.  MyChart is used to connect with patients for Virtual Visits (Telemedicine).  Patients are able to view lab/test results, encounter notes, upcoming appointments, etc.  Non-urgent messages can be sent to your provider as well.   To learn more about what you can do with MyChart, go to [email or web address].    Your next appointment:   1 year(s)  The format for your next appointment:   In Person  Provider:   You may see Steven Klein, MD or one of the following Advanced Practice Providers on your designated Care Team:   Renee Ursuy, PA-C Michael "Andy" Tillery, PA-C   Other Instructions   Important Information About Sugar       

## 2022-01-15 ENCOUNTER — Other Ambulatory Visit: Payer: Self-pay | Admitting: Internal Medicine

## 2022-03-02 DIAGNOSIS — Z125 Encounter for screening for malignant neoplasm of prostate: Secondary | ICD-10-CM | POA: Diagnosis not present

## 2022-03-02 DIAGNOSIS — Z8679 Personal history of other diseases of the circulatory system: Secondary | ICD-10-CM | POA: Diagnosis not present

## 2022-03-02 DIAGNOSIS — I480 Paroxysmal atrial fibrillation: Secondary | ICD-10-CM | POA: Diagnosis not present

## 2022-03-02 DIAGNOSIS — Z0000 Encounter for general adult medical examination without abnormal findings: Secondary | ICD-10-CM | POA: Diagnosis not present

## 2022-03-02 DIAGNOSIS — Z13220 Encounter for screening for lipoid disorders: Secondary | ICD-10-CM | POA: Diagnosis not present

## 2022-03-31 DIAGNOSIS — Z23 Encounter for immunization: Secondary | ICD-10-CM | POA: Diagnosis not present

## 2022-06-04 DIAGNOSIS — Z23 Encounter for immunization: Secondary | ICD-10-CM | POA: Diagnosis not present

## 2022-07-19 DIAGNOSIS — M1611 Unilateral primary osteoarthritis, right hip: Secondary | ICD-10-CM | POA: Diagnosis not present

## 2022-10-01 DIAGNOSIS — D225 Melanocytic nevi of trunk: Secondary | ICD-10-CM | POA: Diagnosis not present

## 2022-10-01 DIAGNOSIS — D2261 Melanocytic nevi of right upper limb, including shoulder: Secondary | ICD-10-CM | POA: Diagnosis not present

## 2022-10-01 DIAGNOSIS — D224 Melanocytic nevi of scalp and neck: Secondary | ICD-10-CM | POA: Diagnosis not present

## 2022-10-01 DIAGNOSIS — L218 Other seborrheic dermatitis: Secondary | ICD-10-CM | POA: Diagnosis not present

## 2022-10-01 DIAGNOSIS — A630 Anogenital (venereal) warts: Secondary | ICD-10-CM | POA: Diagnosis not present

## 2022-10-01 DIAGNOSIS — L918 Other hypertrophic disorders of the skin: Secondary | ICD-10-CM | POA: Diagnosis not present

## 2022-10-01 DIAGNOSIS — D485 Neoplasm of uncertain behavior of skin: Secondary | ICD-10-CM | POA: Diagnosis not present

## 2022-12-23 DIAGNOSIS — R051 Acute cough: Secondary | ICD-10-CM | POA: Diagnosis not present

## 2022-12-23 DIAGNOSIS — R110 Nausea: Secondary | ICD-10-CM | POA: Diagnosis not present

## 2022-12-23 DIAGNOSIS — U071 COVID-19: Secondary | ICD-10-CM | POA: Diagnosis not present

## 2022-12-23 DIAGNOSIS — R0981 Nasal congestion: Secondary | ICD-10-CM | POA: Diagnosis not present

## 2023-03-09 ENCOUNTER — Other Ambulatory Visit: Payer: Self-pay | Admitting: Internal Medicine

## 2023-03-30 DIAGNOSIS — I480 Paroxysmal atrial fibrillation: Secondary | ICD-10-CM | POA: Diagnosis not present

## 2023-03-30 DIAGNOSIS — A609 Anogenital herpesviral infection, unspecified: Secondary | ICD-10-CM | POA: Diagnosis not present

## 2023-03-30 DIAGNOSIS — Z125 Encounter for screening for malignant neoplasm of prostate: Secondary | ICD-10-CM | POA: Diagnosis not present

## 2023-03-30 DIAGNOSIS — E782 Mixed hyperlipidemia: Secondary | ICD-10-CM | POA: Diagnosis not present

## 2023-03-30 DIAGNOSIS — Z0000 Encounter for general adult medical examination without abnormal findings: Secondary | ICD-10-CM | POA: Diagnosis not present

## 2023-04-10 NOTE — Progress Notes (Unsigned)
 Electrophysiology Office Note:   Date:  04/13/2023  ID:  Alejandro Hayes, DOB April 28, 1970, MRN [id number]  Primary Cardiologist: None Primary Heart Failure: None Electrophysiologist: Sherryl Manges, MD      History of Present Illness:   Alejandro Hayes is a 53 y.o. male with h/o AF / SVT, PVC's / NSVT, syncope seen today for routine electrophysiology followup.   The patient reports he has done well overall in the last year.  He brings in his cholesterol labs and asks about the numbers.  His wife is a PT and she was concerned.  He denies evidence of AF/SVT in the last year.  He reports he is concerned he has sleep apnea.  His wife notices that he has obstructive periods at night.  He had a sleep study approximately 2 years ago that was reportedly negative but he feels that the data was inaccurate because he was woke up frequently during the exam by the sleep tech.  He has daytime sleepiness-  notes that he feels like he might fall asleep while driving at times.  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  He denies chest pain, palpitations, dyspnea, PND, orthopnea, nausea, vomiting, dizziness, syncope, edema, weight gain, or early satiety.   Review of systems complete and found to be negative unless listed in HPI.   EP Information / Studies Reviewed:    EKG is ordered today. Personal review as below.  EKG Interpretation Date/Time:  Wednesday April 13 2023 09:20:27 EDT Ventricular Rate:  74 PR Interval:  162 QRS Duration:  90 QT Interval:  374 QTC Calculation: 415 R Axis:   60  Text Interpretation: Normal sinus rhythm Confirmed by Canary Brim ([id number]) on 04/13/2023 9:27:39 AM   Studies:  ECHO 2016 > LVEF 55-65% NM Stress Test 01/2020 > NM stress EF 60%, hypertensive response to exercise, normal study / low risk study Zio 08/2020 > NSR predominant rhythm, 2 VT runs fastest lasting 10 beats, 2 SVT runs occurred, the run with the fastest interval lasting 6 beats with a max  rate of 156 bpm, the longest lasting 8 beats with an avg rate of 108 bpm. Supraventricular Tachycardia was detected within +/- 45 seconds of symptomatic patient event(s).  Arrhythmia / AAD AF  > dx 07/2012, DCCV    Risk Assessment/Calculations:    CHA2DS2-VASc Score = 0   This indicates a 0.2% annual risk of stroke. The patient's score is based upon: CHF History: 0 HTN History: 0 Diabetes History: 0 Stroke History: 0 Vascular Disease History: 0 Age Score: 0 Gender Score: 0         STOP-Bang Score:  6       Physical Exam:   VS:  BP 136/84   Pulse 74   Ht 6' (1.829 m)   Wt 221 lb 3.2 oz (100.3 kg)   SpO2 99%   BMI 30.00 kg/m    Wt Readings from Last 3 Encounters:  04/13/23 221 lb 3.2 oz (100.3 kg)  01/08/22 224 lb (101.6 kg)  11/25/20 212 lb (96.2 kg)     GEN: Pleasant, well nourished, well developed in no acute distress NECK: No JVD; No carotid bruits CARDIAC: Regular rate and rhythm, no murmurs, rubs, gallops RESPIRATORY:  Clear to auscultation without rales, wheezing or rhonchi  ABDOMEN: Soft, non-tender, non-distended EXTREMITIES:  No edema; No deformity   ASSESSMENT AND PLAN:    Paroxysmal Atrial Fibrillation  CHA2DS2-VASc 0  -no symptom burden   -Reviewed monitoring with  Kardia mobile if he develops symptoms -low risk score, no indication for OAC at this time  -EKG with NSR  -Encouraged patient to avoid excessive alcohol use, ensure adequate sleep, daily exercise at least 30 minutes to mitigate risk factors for atrial fibrillation  NSVT  -continue metoprolol  -Encouraged him to take it at night in an effort to potentially reduce daytime fatigue  Suspected OSA -Patient has significant [place] that he has sleep apnea-noted daytime fatigue, obstructive periods, loud snoring -Assess Itamar sleep study -STOP BANG 6 -pt has gained weight in the last year   Hyperlipidemia  -patient is interested in risk modification and has concerns for cholesterol  levels -We discussed potentially obtaining a coronary artery calcium score -Will refer to lifestyle medicine with [facility] for further management  Follow up with Dr. Graciela Husbands in 12 months  Signed, Canary Brim, NP-C, AGACNP-BC [facility] - Electrophysiology  04/13/2023, 10:34 AM

## 2023-04-13 ENCOUNTER — Encounter: Payer: Self-pay | Admitting: Pulmonary Disease

## 2023-04-13 ENCOUNTER — Ambulatory Visit: Attending: Pulmonary Disease | Admitting: Pulmonary Disease

## 2023-04-13 VITALS — BP 136/84 | HR 74 | Ht 72.0 in | Wt 221.2 lb

## 2023-04-13 DIAGNOSIS — R0683 Snoring: Secondary | ICD-10-CM

## 2023-04-13 DIAGNOSIS — I480 Paroxysmal atrial fibrillation: Secondary | ICD-10-CM | POA: Diagnosis not present

## 2023-04-13 DIAGNOSIS — G4719 Other hypersomnia: Secondary | ICD-10-CM | POA: Diagnosis not present

## 2023-04-13 DIAGNOSIS — I4729 Other ventricular tachycardia: Secondary | ICD-10-CM

## 2023-04-13 NOTE — Patient Instructions (Addendum)
 Medication Instructions:  Your physician recommends that you continue on your current medications as directed. Please refer to the Current Medication list given to you today.  *If you need a refill on your cardiac medications before your next appointment, please call your pharmacy*  Lab Work: None ordered If you have labs (blood work) drawn today and your tests are completely normal, you will receive your results only by: MyChart Message (if you have MyChart) OR A paper copy in the mail If you have any lab test that is abnormal or we need to change your treatment, we will call you to review the results.  Testing/Procedures: Your physician has recommended that you have a sleep study. This test records several body functions during sleep, including: brain activity, eye movement, oxygen and carbon dioxide blood levels, heart rate and rhythm, breathing rate and rhythm, the flow of air through your mouth and nose, snoring, body muscle movements, and chest and belly movement.   Follow-Up: At [facility], you and your health needs are our priority.  As part of our continuing mission to provide you with exceptional heart care, our providers are all part of one team.  This team includes your primary Cardiologist (physician) and Advanced Practice Providers or APPs (Physician Assistants and Nurse Practitioners) who all work together to provide you with the care you need, when you need it.  Your next appointment:   Next available  Provider:   Eligha Bridegroom, NP for healthy lifestyle consult  1 year with EP APP  Consider purchasing KardiaMobile device if you have symptoms as discussed. If you do have symptoms call and let us know. AliveCor  FDA-cleared EKG at your fingertips. - [employer]  [employer], [employer]. https://store.alivecor.com/products/[email or web address]   FDA-cleared, clinical grade mobile EKG monitor: Lourena Simmonds is the most clinically-validated mobile EKG used by the  world's leading cardiac care medical professionals.  This may be useful in monitoring palpitations.  We do not have access to have them emailed and reviewed but will be glad to review while in the office.         1st Floor: - Lobby - Registration  - Pharmacy  - Lab - Cafe  2nd Floor: - PV Lab - Diagnostic Testing (echo, CT, nuclear med)  3rd Floor: - Vacant  4th Floor: - TCTS (cardiothoracic surgery) - AFib Clinic - Structural Heart Clinic - Vascular Surgery  - Vascular Ultrasound  5th Floor: - HeartCare Cardiology (general and EP) - Clinical Pharmacy for coumadin, hypertension, lipid, weight-loss medications, and med management appointments    Valet parking services will be available as well.

## 2023-04-16 ENCOUNTER — Other Ambulatory Visit: Payer: Self-pay | Admitting: Internal Medicine

## 2023-05-09 DIAGNOSIS — L218 Other seborrheic dermatitis: Secondary | ICD-10-CM | POA: Diagnosis not present

## 2023-05-09 DIAGNOSIS — L245 Irritant contact dermatitis due to other chemical products: Secondary | ICD-10-CM | POA: Diagnosis not present

## 2023-05-09 DIAGNOSIS — L282 Other prurigo: Secondary | ICD-10-CM | POA: Diagnosis not present

## 2023-05-09 DIAGNOSIS — L308 Other specified dermatitis: Secondary | ICD-10-CM | POA: Diagnosis not present

## 2023-05-11 NOTE — Telephone Encounter (Signed)
 Hey there, do we have Alejandro Hayes prior auth for this pt?

## 2023-05-12 ENCOUNTER — Telehealth: Payer: Self-pay

## 2023-05-12 ENCOUNTER — Encounter: Payer: Self-pay | Admitting: Pulmonary Disease

## 2023-05-12 DIAGNOSIS — I480 Paroxysmal atrial fibrillation: Secondary | ICD-10-CM

## 2023-05-12 DIAGNOSIS — R0683 Snoring: Secondary | ICD-10-CM

## 2023-05-12 DIAGNOSIS — G4719 Other hypersomnia: Secondary | ICD-10-CM

## 2023-05-12 NOTE — Telephone Encounter (Signed)
**Note De-Identified Loghan Kurtzman Obfuscation** Itamar-HST PA started through the [employer] provider portal: Order ID: [id number] Pending review.   Anticipated Determination Date:05/16/2023

## 2023-05-12 NOTE — Telephone Encounter (Signed)
**Note De-Identified Alejandro Hayes Obfuscation** Per [facility] message received from the pt on 5/7, I have placed an ordered for a Itamar-HST that was ordered at the pts office visit with Creighton Doffing, NP on 04/13/23.

## 2023-05-12 NOTE — Progress Notes (Signed)
 Pt requested letter for DOT physical.  Added to chart.      Creighton Doffing, NP-C, AGACNP-BC [facility] - Electrophysiology  05/12/2023, 8:28 AM

## 2023-05-17 NOTE — Telephone Encounter (Signed)
**Note De-Identified Sylva Overley Obfuscation** Letter received from [employer] stating that they denied the Itamar-HST PA that I did on 5/8 because clinicals did not attach to the PA I did through the [facility] provider  portal.  Ordering provider: Creighton Doffing, NP Associated diagnoses: Snoring-R06.83 and Somnolence-R40.0  WatchPAT PA obtained on 05/17/2023 by Toua Stites, Isabella Mao, LPN. Authorization: I started another PA this morning and did receive an approval: Order ID: [id number] Authorized Approval Valid Through:05/17/2023 - 07/15/2023   Patient notified of PIN (1234) on 05/17/2023 Delara Shepheard Notification Method: phone.  Phone note routed to covering staff for follow-up.

## 2023-05-23 ENCOUNTER — Encounter (INDEPENDENT_AMBULATORY_CARE_PROVIDER_SITE_OTHER): Payer: Self-pay | Admitting: Cardiology

## 2023-05-23 DIAGNOSIS — G4733 Obstructive sleep apnea (adult) (pediatric): Secondary | ICD-10-CM

## 2023-05-30 ENCOUNTER — Ambulatory Visit: Attending: Pulmonary Disease

## 2023-05-30 DIAGNOSIS — I480 Paroxysmal atrial fibrillation: Secondary | ICD-10-CM

## 2023-05-30 DIAGNOSIS — R0683 Snoring: Secondary | ICD-10-CM

## 2023-05-30 DIAGNOSIS — G4719 Other hypersomnia: Secondary | ICD-10-CM

## 2023-05-30 NOTE — Procedures (Signed)
   SLEEP STUDY REPORT Patient Information Study Date: 05/23/2023 Patient Name: Alejandro Hayes Patient ID: [id number] Birth Date: Sep 16, 1970 Age: 53 Gender: BMI: 29.0 (W=214 lb, H=6' 0'') Referring Physician: Michaelle Adolphus, PA  TEST DESCRIPTION: Home sleep apnea testing was completed using the WatchPat, a Type 1 device, utilizing peripheral arterial tonometry (PAT), chest movement, actigraphy, pulse oximetry, pulse rate, body position and snore. AHI was calculated with apnea and hypopnea using valid sleep time as the denominator. RDI includes apneas, hypopneas, and RERAs. The data acquired and the scoring of sleep and all associated events were performed in accordance with the recommended standards and specifications as outlined in the AASM Manual for the Scoring of Sleep and Associated Events 2.2.0 (2015).  FINDINGS:  1. Mild Obstructive Sleep Apnea with AHI 5.8/hr.  2. No Central Sleep Apnea with pAHIc 0.3/hr.  3. Oxygen desaturations as low as 76%.  4. Mild to moderate snoring was present. O2 sats were < 88% for 2.9 min.  5. Total sleep time was 6 hrs and .  6. 15.9% of total sleep time was spent in REM sleep.  7. Normal sleep onset latency at 13 min.  8. Shortened REM sleep onset latency at 57 min.  9. Total awakenings were 10. 10. Arrhythmia detection: None  DIAGNOSIS: Mild Obstructive Sleep Apnea (G47.33)  RECOMMENDATIONS: 1. Clinical correlation of these findings is necessary. The decision to treat obstructive sleep apnea (OSA) is usually based on the presence of apnea symptoms or the presence of associated medical conditions such as Hypertension, Congestive Heart Failure, Atrial Fibrillation or Obesity. The most common symptoms of OSA are snoring, gasping for breath while sleeping, daytime sleepiness and fatigue. 2. Initiating apnea therapy is recommended given the presence of symptoms and/or associated conditions. Recommend proceeding with one of the following:  a.  Auto-CPAP therapy with a pressure range of 5-20cm H2O.  b. An oral appliance (OA) that can be obtained from certain dentists with expertise in sleep medicine. These are primarily of use in non-obese patients with mild and moderate disease.  c. An ENT consultation which may be useful to look for specific causes of obstruction and possible treatment options.  d. If patient is intolerant to PAP therapy, consider referral to ENT for evaluation for hypoglossal nerve stimulator. 3. Close follow-up is necessary to ensure success with CPAP or oral appliance therapy for maximum benefit . 4. A follow-up oximetry study on CPAP is recommended to assess the adequacy of therapy and determine the need for supplemental oxygen or the potential need for Bi-level therapy. An arterial blood gas to determine the adequacy of baseline ventilation and oxygenation should also be considered. 5. Healthy sleep recommendations include: adequate nightly sleep (normal 7-9 hrs/night), avoidance of caffeine after noon and alcohol near bedtime, and maintaining a sleep environment that is cool, dark and quiet. 6. Weight loss for overweight patients is recommended. Even modest amounts of weight loss can significantly improve the severity of sleep apnea. 7. Snoring recommendations include: weight loss where appropriate, side sleeping, and avoidance of alcohol before bed. 8. Operation of motor vehicle should be avoided when sleepy.  Signature: Gaylyn Keas, MD; [facility]; Diplomat, American Board of Sleep Medicine Electronically Signed: 05/30/2023 7:59:14 PM

## 2023-06-01 ENCOUNTER — Telehealth: Payer: Self-pay | Admitting: *Deleted

## 2023-06-01 NOTE — Telephone Encounter (Signed)
 The patient has been notified of the result and verbalized understanding.    Patient agrees to set up OV to discuss treatment options. Message sent to schedulers to call patient with appointment.

## 2023-06-01 NOTE — Telephone Encounter (Signed)
-----   Message from Gaylyn Keas sent at 05/30/2023  8:00 PM EDT ----- Patient has very mild OSA - set up OV to discuss treatment options.

## 2023-06-09 NOTE — Progress Notes (Unsigned)
 Virtual Visit via Video Note   Because of Jaxx C Siefken's co-morbid illnesses, he is at least at moderate risk for complications without adequate follow up.  This format is felt to be most appropriate for this patient at this time.  All issues noted in this document were discussed and addressed.  A limited physical exam was performed with this format.  Please refer to the patient's chart for his consent to telehealth for [facility].      Date:  06/09/2023   ID:  Tex Filbert, DOB 06/18/1970, MRN [id number] The patient was identified using 2 identifiers.  Patient Location: Home Provider Location: Home Office   PCP:  Victorio Grave, MD   [facility] Providers Cardiologist:  None Electrophysiologist:  Richardo Chandler, MD     Evaluation Performed:  New Patient Evaluation  Chief Complaint:  OSA  History of Present Illness:    SHAWNTA ZIMBELMAN is a 53 y.o. male who is being seen today for the evaluation of OSA at the request of Creighton Doffing PA.  KALYB PEMBLE is a 53 y.o. male with a hx of NSVT, atrial fibrillation and underwent HST 11/17/2020 showing no significant OSA.  He was seen back in the office by EP PA, Creighton Doffing and patient complained of excessive daytime fatigue, loud snoring and being told he stopped breathing in his sleep.  Stop Bang Score was 6.  He was set up for HST which showed very mild OSA with an AHI of 5.8/hr.  He is now referred for Sleep Medicine consult to discuss treatment options.    Past Medical History:  Diagnosis Date   Allergic rhinitis    Atrial fibrillation (HCC) 07/2012   Genital herpes    Genital warts    Lumbar herniated disc    NSVT (nonsustained ventricular tachycardia) (HCC)    Spermatocele    right   Past Surgical History:  Procedure Laterality Date   CARDIOVERSION N/A 08/18/2012   Procedure: CARDIOVERSION;  Surgeon: Verona Goodwill, MD;  Location: [facility] ENDOSCOPY;  Service: Cardiovascular;  Laterality: N/A;    LAPAROSCOPIC APPENDECTOMY N/A 10/14/2015   Procedure: APPENDECTOMY LAPAROSCOPIC;  Surgeon: Adalberto Acton, MD;  Location: [facility] ORS;  Service: General;  Laterality: N/A;   SHOULDER ARTHROSCOPY W/ SUPERIOR LABRAL ANTERIOR POSTERIOR LESION REPAIR  2002   right     No outpatient medications have been marked as taking for the 06/10/23 encounter (Appointment) with Jacqueline Matsu, MD.     Allergies:   Patient has no known allergies.   Social History   Tobacco Use   Smoking status: Former   Smokeless tobacco: Never   Tobacco comments:    quit 20 yrs ago  Substance Use Topics   Alcohol use: Yes    Alcohol/week: 1.0 - 2.0 standard drink of alcohol    Types: 1 - 2 Glasses of wine per week    Comment: occasional beer   Drug use: No     Family Hx: The patient's family history includes Alzheimer's disease in his maternal grandfather; Alzheimer's disease (age of onset: 58) in his paternal grandfather; Arthritis in his mother; Cancer in his paternal grandmother; Diabetes in his maternal grandmother; Hypertension in his father; Stroke (age of onset: 66) in his father.  ROS:   Please see the history of present illness.     All other systems reviewed and are negative.   Prior Sleep studies:   The following studies were reviewed today:  HST 05/2023  Labs/Other Tests and Data Reviewed:     Recent Labs: No results found for requested labs within last 365 days.    Wt Readings from Last 3 Encounters:  04/13/23 221 lb 3.2 oz (100.3 kg)  01/08/22 224 lb (101.6 kg)  11/25/20 212 lb (96.2 kg)     Risk Assessment/Calculations:    CHA2DS2-VASc Score = 0  {Confirm score is correct.  If not, click here to update score.  REFRESH note.  :1} This indicates a 0.2% annual risk of stroke. The patient's score is based upon: CHF History: 0 HTN History: 0 Diabetes History: 0 Stroke History: 0 Vascular Disease History: 0 Age Score: 0 Gender Score: 0      STOP-Bang Score:  6  { Consider Dx  Sleep Disordered Breathing or Sleep Apnea  ICD G47.33          :1}    Objective:    Vital Signs:  There were no vitals taken for this visit.   VITAL SIGNS:  reviewed GEN:  no acute distress EYES:  sclerae anicteric, EOMI - Extraocular Movements Intact RESPIRATORY:  normal respiratory effort, symmetric expansion CARDIOVASCULAR:  no peripheral edema SKIN:  no rash, lesions or ulcers. MUSCULOSKELETAL:  no obvious deformities. NEURO:  alert and oriented x 3, no obvious focal deficit PSYCH:  normal affect  ASSESSMENT & PLAN:    OSA  -patient recently complained of excessive daytime sleepiness, loud snoring and witnessed apneas with Stop Bang score of 6 -HST showed very mild OSA AHI 5.6/hr -treatment options include a trial of CPAP therapy, weight loss and avoid sleeping supine +/- referral to dentistry for oral device.  Time:   Today, I have spent *** minutes with the patient with telehealth technology discussing the above problems.     Medication Adjustments/Labs and Tests Ordered: Current medicines are reviewed at length with the patient today.  Concerns regarding medicines are outlined above.   Tests Ordered: No orders of the defined types were placed in this encounter.   Medication Changes: No orders of the defined types were placed in this encounter.   Follow Up:  {F/U [phone]} {follow up:15908}  Signed, Gaylyn Keas, MD  06/09/2023 10:38 PM    [facility]

## 2023-06-10 ENCOUNTER — Encounter: Payer: Self-pay | Admitting: Cardiology

## 2023-06-10 ENCOUNTER — Ambulatory Visit: Attending: Cardiology | Admitting: Cardiology

## 2023-06-10 VITALS — Ht 72.0 in | Wt 220.0 lb

## 2023-06-10 DIAGNOSIS — G4733 Obstructive sleep apnea (adult) (pediatric): Secondary | ICD-10-CM

## 2023-06-13 ENCOUNTER — Telehealth: Payer: Self-pay

## 2023-06-13 DIAGNOSIS — G4733 Obstructive sleep apnea (adult) (pediatric): Secondary | ICD-10-CM

## 2023-06-13 DIAGNOSIS — I4729 Other ventricular tachycardia: Secondary | ICD-10-CM

## 2023-06-13 DIAGNOSIS — I480 Paroxysmal atrial fibrillation: Secondary | ICD-10-CM

## 2023-06-13 DIAGNOSIS — R0683 Snoring: Secondary | ICD-10-CM

## 2023-06-13 DIAGNOSIS — G4719 Other hypersomnia: Secondary | ICD-10-CM

## 2023-06-13 NOTE — Telephone Encounter (Signed)
-----   Message from Gaylyn Keas sent at 06/10/2023  8:09 AM EDT ----- Order ResMed auto CPAP from 4 to 12cm H2O with a nasal pillow mask and heated humidity

## 2023-06-13 NOTE — Telephone Encounter (Signed)
 Notified patient that order for ResMed auto CPAP from 4 to 12cm H2O with a nasal pillow mask and heated humidity has been sent to [facility] today. Patient will reach out to sleep coordinator once device is received. All questions were answered and patient verbalized understanding.

## 2023-07-05 ENCOUNTER — Encounter (HOSPITAL_BASED_OUTPATIENT_CLINIC_OR_DEPARTMENT_OTHER): Payer: Self-pay | Admitting: Nurse Practitioner

## 2023-07-05 ENCOUNTER — Ambulatory Visit (INDEPENDENT_AMBULATORY_CARE_PROVIDER_SITE_OTHER): Admitting: Nurse Practitioner

## 2023-07-05 VITALS — BP 118/86 | HR 81 | Ht 72.0 in | Wt 221.2 lb

## 2023-07-05 DIAGNOSIS — G4733 Obstructive sleep apnea (adult) (pediatric): Secondary | ICD-10-CM | POA: Diagnosis not present

## 2023-07-05 DIAGNOSIS — E785 Hyperlipidemia, unspecified: Secondary | ICD-10-CM | POA: Diagnosis not present

## 2023-07-05 DIAGNOSIS — I4729 Other ventricular tachycardia: Secondary | ICD-10-CM

## 2023-07-05 DIAGNOSIS — Z7189 Other specified counseling: Secondary | ICD-10-CM

## 2023-07-05 NOTE — Progress Notes (Signed)
 Cardiology Office Note   Date:  07/05/2023  ID:  Alejandro Hayes, DOB 1970-04-23, MRN [id number] PCP: Teresa Channel, MD  [facility] Providers Cardiologist:  None Electrophysiologist:  Elspeth Sage, MD     [facility] Syncope Paroxysmal atrial fibrillation DCCV 07/2012 SVT PVCs NSVT Hyperlipidemia Coronary calcium score of 0 on CT 03/2014 Low risk exercise myoview  01/2020 Snoring OSA on CPAP  Seen by Dr. Sage in 2014 noting spells of heart racing, and lightheadedness particularly while exercising.  He noted a sensation of needing to catch his breath particularly when preparing to serve a tennis ball.  He had an episode of syncope while swimming and spending time in the steam room afterwards.  He was walking out of the steam room lost consciousness and fell against the wall.  Exercise treadmill test and echocardiogram done with Dr. Jeffrie at [place], both normal.  Event monitor revealed sinus tachycardia, PVC, and nonsustained ventricular tachycardia for morphology suggestive of a left bundle inferior axis.  He was placed on diltiazem .  Diltiazem  was later changed to metoprolol . Signal averaged ECG was ordered.  He was noted to be in A-fib 07/2012 and was placed on Xarelto  and underwent cardioversion.  Repeat cardiac monitor 08/2020 predominant underlying rhythm sinus rhythm, 2 runs of ventricular tachycardia, fastest and longest lasting 10 beats with average 150 bpm, 2 runs of SVT longest lasting 8 beats, no sustained ectopy, no symptoms associated with sinus rhythm.  [facility] sleep study 06/2023 revealed mild sleep apnea with recommendation for CPAP with nasal pillow and heated humidity.   History of Present Illness Discussed the use of AI scribe software for clinical note transcription with the patient, who gave verbal consent to proceed.  History of Present Illness Alejandro Hayes is a very pleasant 53 year old male who is here to discuss cardiovascular risk. He has a history of VT  with symptoms that presented when he was very active with regular tennis and cardiovascular exercise.  He had an episode of syncope prior to diagnosis; no further episodes of presyncope or syncope. He is concerned about his rising cholesterol levels, with recent measurements showing LDL at 138 mg/dL, HDL at 40 mg/dL, and triglycerides at 854 mg/dL. He is making dietary changes to manage his cholesterol and weight, replacing processed snacks with fruits and eating a more Mediterranean style diet. He admits he has not been active recently due to work commitments. 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 Stress from managing the business and family responsibilities affects his sleep and overall health. Home sleep study indicates mild sleep apnea and he is awaiting CPAP equipment. He often wakes at night due to stress and frequent urination.  Family history not significant for early CAD. His family history includes his mother with congestive heart failure and his father who had a mini-stroke. Both parents were on cholesterol and high blood pressure medications.  He denies chest pain, shortness of breath, palpitations, orthopnea, PND, edema. No further evidence of a fib since cardioversion 2014. History of CT calcium score of 0 in 2016, normal exercise Myoview  in 2022.  ASCVD (Atherosclerotic Cardiovascular Disease) 2013 Risk Calculator from AHA/ACC from [email or web address]  on 07/05/2023 ** All calculations should be rechecked by clinician prior to use **  RESULT SUMMARY:   Moderate-intensity statin recommended because of 10-year risk between 5-7.5%. To view statin dosages by intensity, see Evidence section.  5.1%   Risk of cardiovascular event (coronary or stroke death or non-fatal  MI or stroke) in next 10 years.  2.9%   10-year cardiovascular risk if risk factors were optimal.  INPUTS: Age --> 52 years Diabetes --> 0 = No Sex --> 1 = Male Smoker --> 0 =  No Total cholesterol --> 204 mg/dL HDL cholesterol --> 40 mg/dL Systolic blood pressure --> 118 mm Hg Treatment for hypertension --> 0 = No Race --> 1 = White  Diet Mediterranean, but eats out often Etoh occasionally  Activity Owns a trucking company Sometimes more active than others No exercise currently  ROS: See HPI  Studies Reviewed      No results found for: LIPOA  Risk Assessment/Calculations  CHA2DS2-VASc Score = 0   This indicates a 0.2% annual risk of stroke. The patient's score is based upon: CHF History: 0 HTN History: 0 Diabetes History: 0 Stroke History: 0 Vascular Disease History: 0 Age Score: 0 Gender Score: 0         STOP-Bang Score:  6      Physical Exam VS:  BP 118/86   Pulse 81   Ht 6' (1.829 m)   Wt 221 lb 3.2 oz (100.3 kg)   SpO2 99%   BMI 30.00 kg/m    Wt Readings from Last 3 Encounters:  07/05/23 221 lb 3.2 oz (100.3 kg)  06/10/23 220 lb (99.8 kg)  04/13/23 221 lb 3.2 oz (100.3 kg)    GEN: Well nourished, well developed in no acute distress NECK: No JVD; No carotid bruits CARDIAC: RRR, no murmurs, rubs, gallops RESPIRATORY:  Clear to auscultation without rales, wheezing or rhonchi  ABDOMEN: Soft, non-tender, non-distended EXTREMITIES:  No edema; No deformity    Assessment & Plan Cardiac Risk Counseling He is concerned about being cholesterol levels.  We had a lengthy discussion about ASCVD risk.  He has a 10-year ASCVD risk score of 5.1%.  We discussed the individual components at this visit.  Hypertension, diabetes, or smoking.  Recommendation to work on eating a plant forward, heart healthy diet avoiding saturated fat, sugar, processed foods, and simple carbohydrates and aim to get at least 150 minutes of moderate intensity exercise each week. He plans to start walking and cycling. Encouraged him to avoid HIIT type exercise that may exacerbate arrhythmia. Advised max HR 220 minus age - aim for 80-85% max HR.   Ventricular  tachycardia   History of ventricular tachycardia with previous syncope.  He is on metoprolol  succinate 25 mg daily.  No recent tachypalpitations, presyncope, or syncope.  Moderate exercise is encouraged with caution to avoid arrhythmias.   Elevated LDL cholesterol   His LDL is 138 mg/dL, above target, with possible hereditary lipoprotein A elevation and an ASCVD risk of 5.1%. A repeat cholesterol panel, including LP(a) and particle breakdown, is ordered in 3 months. He is encouraged to adopt a Mediterranean diet and lifestyle changes to lower LDL. Consider coronary CT for plaque volume in the future.  Sleep apnea   He has mild sleep apnea with 5.8-5.9 apneic episodes per hour and is awaiting CPAP. Follow up with the medical supply company for CPAP. Weight management and lifestyle changes are encouraged to improve sleep apnea. Alcohol intake should be limited, especially before bedtime.        Dispo: 3 months with me  Signed, Rosaline Bane, NP-C

## 2023-07-05 NOTE — Patient Instructions (Signed)
 Medication Instructions:   Your physician recommends that you continue on your current medications as directed. Please refer to the Current Medication list given to you today.   *If you need a refill on your cardiac medications before your next appointment, please call your pharmacy*  Lab Work:  Your physician recommends that you return for a FASTING NMR/LPa, fasting after midnight the week before your appointment with Alejandro Hayes PIETY. Paperwork given to patient today.    If you have labs (blood work) drawn today and your tests are completely normal, you will receive your results only by: MyChart Message (if you have MyChart) OR A paper copy in the mail If you have any lab test that is abnormal or we need to change your treatment, we will call you to review the results.  Testing/Procedures:  None ordered.   Follow-Up: At [facility], you and your health needs are our priority.  As part of our continuing mission to provide you with exceptional heart care, our providers are all part of one team.  This team includes your primary Cardiologist (physician) and Advanced Practice Providers or APPs (Physician Assistants and Nurse Practitioners) who all work together to provide you with the care you need, when you need it.  Your next appointment:   3 month(s)  Provider:   Rosaline Percy, NP    We recommend signing up for the patient portal called MyChart.  Sign up information is provided on this After Visit Summary.  MyChart is used to connect with patients for Virtual Visits (Telemedicine).  Patients are able to view lab/test results, encounter notes, upcoming appointments, etc.  Non-urgent messages can be sent to your provider as well.   To learn more about what you can do with MyChart, go to [email or web address].   Other Instructions  Tackling Obesity with Lifestyle Changes  Obesity- What is it? And What can we do about it?  Obesity is a chronic complex disease  defined as excessive fat deposits that can have a negative effect on our health. It can lead to many other diseases including type 2 diabetes.  Weight gain occurs when the amount of energy (calories) we consume is greater than the amount we use.  When our energy output is greater than our energy input we lose weight. The basic concept is simple, but in reality, it's much more complicated.  Unfortunately, in some people, our bodies have many ways it can compensate when we try to eat less and move more which can prevent us  from changing our weight. This can lead to some people having a much more difficult time losing weight even when they put healthy habits into practice. This can be frustrating. We want to focus on healthy habits, physical activity and how we feel, and less the number on the scale.  Food As Energy  Calories  Calories is just a unit of measurement for energy.  Counting calories is not required to lose weight but counting for a short period of time can:   help you learn good portion sizes   Learn what your true energy needs are.   Help you be more aware of your snacking or grazing habits  To help calculate how many calories you should be eating, the NIH has a great body weight planner calculator at BeverageBuggy.si  Types of Energy Expenditure  Basal Metabolic Rate (BMR) Energy that our bodies use to preform everyday tasks. More muscle mass through resistance training can increase this a small amount  Thermic Effect  of Food The amount of energy that it takes to breakdown the food we eat. This will be highest when we eat protein and fiber rich foods  Exercise Energy Expenditure The amount of energy used during formal exercise (walking, biking, weightlifting)  Non-exercise activity thermogenesis (NEAT) The amount of energy spent on activities that are not formal exercise (standing, fidgeting). Therefore, it is not only important to do formal exercise but also move  around throughout the day.  Managing The Meal  Macro nutrients (carbohydrates, fats and protein, fiber, water)  Micronutrients (vitamins, minerals)  Dietary Fiber  Benefits Examples Cautions  Soluble fiber  Decreases cholesterol  improve blood sugar control,  Feeds our gut bacteria  Allows us  to feel fuller for longer so we eat less  fruits  oats  barley  legumes  peas  Beans  vegetables (broccoli) and root vegetables (carrots) Add fiber into your diet slowly and be sure to drink at least 8 cups of water a day. This will help limit gas, bloating, diarrhea, or constipation.  Insoluble Fiber  Improves digestive health by making stool easier to pass  Allows us  to feel fuller for longer so we eat less  whole grains  nuts  seeds  skin of fruit  vegetables (green beans, zucchini, cauliflower)  Tricks to add more fiber to your diet   Add beans (pinto, kidney, lima, navy and garbanzo) to salads, ground meat or brown rice   Add nuts or seeds and or fresh/frozen fruit to yogurt, cottage cheese, salads or steel cut oats   Cut up vegetables and eat with hummus   Look for unsweetened whole grain cereals with at least 5g of fiber per serving   Switch to whole grain bread. Look for bread that has whole grain flour as the first ingredient and has more fiber than carbs if you were to multiple the fiber x 10.   Try bulgar, barely, quinoa, buckwheat, brown rice wild rice instead of white rice   Keep frozen vegetables on hand to add to dishes or soups  Meal Planning:  Meal planning is the key to setting you up for success. Here are some examples of healthy meal options.  Breakfast  Option 1: Omelette with vegetables (1 egg, spinach, mushrooms, or other vegetable of your choice), 2 slices whole-grain toast, tip of thumb size butter or soft margarine,  cup low-fat milk or yogurt  Option 2: steel-cut rolled oats (? cup dry), 1 tbsp peanut butter added to cooked oats,  cup  low-fat milk.  Option 3: 2 slices whole-grain or rye toast with avocado spread ( small avocado mased with herbs and pepper to taste), 1 poached egg or [place] up (cooked to your liking)  Option 4:  cup plain 0% [place] yogurt topped with  cup berries and  cup walnuts or almonds, 2 slices whole-grain or rye toast, tip of thumb size soft margarine/butter  Lunch:  Option 1: 2 cups red lentil soup, green salad with 1 tbsp homemade vinaigrette (extra virgin olive oil and vinegar of choice plus spices)  Option 2: 3 oz. roasted chicken, 2 slices whole-grain bread, 2 tsp mayonnaise, mustard, lettuce, tomato if desired, 1 fruit (example: medium-sized apple or small pear)  Option 3: 3 oz. tuna packed in water, 1 whole-wheat pita (6 inch), 2 tsp mayonnaise, lettuce, tomato, or other non-starchy vegetable of your choice, 1 fruit (example: medium-sized apple or small pear)  Option 4: 1 serving of garden veggie buddha bowl with lentils and tahini sauce  and 1 cup berries topped with  cup plain 0% Greek yogurt  Dinner:  Option 1: 1 serving roasted cauliflower salad, 3-4 oz. grilled or baked pork loin chop, 1/2 cup mashed potato, or brown rice or quinoa  Option 2: 1 serving fish (baked, grilled or air fried), green salad, 1 tbsp homemade vinaigrette,  cup cooked couscous  Option 3: 1 cup cooked whole grained pasta (example: spaghetti, spirals, macaroni),  cup favorite pasta sauce (preferably homemade), 3-4 oz. grilled or baked chicken, green salad, 1 tbsp homemade vinaigrette  Option 4: 1 serving oven roasted salmon,  cup mashed sweet potato or couscous or brown rice or quinoa, broccoli (steamed or roasted)  Healthy snacks:   Carrots or celery with 1 tbsp of hummus   1 medium-sized fruit (apple or orange)   1 cup plain 0% [place] yogurt with  cup berries   Half apple, sliced, with 1 tbsp (15 mL) peanut or almond butter  Dining out:  Eating away from home has become a part of many  people's lifestyle. Making healthy choices when you are eating out is important too. Portion size is an important part of healthy choices. Most branded fast-food places provide calories, sodium, and fat content for their menu items. www.calorieking.com would be great resource to find nutrition facts for your favorite brands and fast-food restaurants. Company specific website can be Chief Technology Officer for nutrition information for their items. (e.g. www.mcdonalds.com or www.nutritionix.com/[place]/menu/premium)  Here are some tips to help you make wise food choices when you are dining out.  Chose more often Avoid  Beverages   Choose more often: Water, low fat milk  Sugar-free/diet drinks  Unsweet tea or coffee    Avoid: Milkshakes, fruit drinks, regular pop  Alcohol, specialty drinks (e.g. iced cappuccino)  Fast food  Choose more often:  Garden salad  Mini subs, pita sandwiches ect with extra vegetables  plain burgers, grilled chicken  Vegetarian or cheese pizza with whole-grain crust    Avoid: Burgers/sandwiches with bacon, cheese, and high-fat sauces  [place] fries, fried chicken, fried fish, poutine, hash browns  Pizza with processed meats  Starters   Choose more often: Raw vegetables, salads (garden, spinach, fruit)  clear or vegetable soups  Seafood cocktail  Whole-grain breads and rolls    Avoid: Salads with high-fat dressings or toppings  Creamy soups  Wings, egg rolls  onion rings, nachos  White or garlic bread  Main courses Grains & Starches (amount equal to  of your plate)  Choose more often:  Oatmeal, high-fiber/lower-sugar cereals  Whole-grain breads, rice, pasta, barley, couscous  Sweet potatoes    Avoid: Sugary, low-fiber cereals  Large bagels, muffins, croissants, white bread  [place] fries, hash browns, fried rice   Meat and alternative (amount equal to  of your plate)  Choose more often:  Lean meats, poultry, fish, eggs, low-fat cheese  Tofu,  vegetable protein Legumes (e.g. lentils, chickpeas, beans)    Avoid: High-salt and/or high-fat meats (e.g. ribs, wings, sausages, wieners, processed lunch meats, imposter meats)   Vegetables (amount equal to  of your plate)  Choose more often:  Salads ([place], garden, spinach), plain vegetables   Avoid:  Salads with creamy, high-fat dressings and   Vegetables on sandwiches ect toppings like bacon bits, croutons, cheese  Desserts  Choose more often:  Fresh fruit, frozen yogourt, skim milk latte    Avoid: Cakes, pies, pastries, ice cream, cheesecake   Adopting a Healthy Lifestyle.   Weight: Know what a healthy weight is  for you (roughly BMI <25) and aim to maintain this. You can calculate your body mass index on your smart phone. Unfortunately, this is not the most accurate measure of healthy weight, but it is the simplest measurement to use. A more accurate measurement involves body scanning which measures lean muscle, fat tissue and bony density. We do not have this equipment at [facility].    Diet: Aim for 7+ servings of fruits and vegetables daily Limit animal fats in diet for cholesterol and heart health - choose grass fed whenever available Avoid highly processed foods (fast food burgers, tacos, fried chicken, pizza, hot dogs, french fries)  Saturated fat comes in the form of butter, lard, coconut oil, margarine, partially hydrogenated oils, and fat in meat. These increase your risk of cardiovascular disease.  Use healthy plant oils, such as olive, canola, soy, corn, sunflower and peanut.  Whole foods such as fruits, vegetables and whole grains have fiber  Men need > 38 grams of fiber per day Women need > 25 grams of fiber per day  Load up on vegetables and fruits - one-half of your plate: Aim for color and variety, and remember that potatoes dont count. Go for whole grains - one-quarter of your plate: Whole wheat, barley, wheat berries, quinoa, oats, brown rice, and foods made  with them. If you want pasta, go with whole wheat pasta. Protein power - one-quarter of your plate: Fish, chicken, beans, and nuts are all healthy, versatile protein sources. Limit red meat. You need carbohydrates for energy! The type of carbohydrate is more important than the amount. Choose carbohydrates such as vegetables, fruits, whole grains, beans, and nuts in the place of white rice, white pasta, potatoes (baked or fried), macaroni and cheese, cakes, cookies, and donuts.  If youre thirsty, drink water. Coffee and tea are good in moderation, but skip sugary drinks and limit milk and dairy products to one or two daily servings. Keep sugar intake at 6 teaspoons or 24 grams or LESS       Exercise: Aim for 150 min of moderate intensity exercise weekly for heart health, and weights twice weekly for bone health Stay active - any steps are better than no steps! Aim for 7-9 hours of sleep daily

## 2023-07-14 ENCOUNTER — Encounter: Payer: Self-pay | Admitting: Cardiology

## 2023-08-03 DIAGNOSIS — G4733 Obstructive sleep apnea (adult) (pediatric): Secondary | ICD-10-CM | POA: Diagnosis not present

## 2023-08-03 DIAGNOSIS — R400 Somnolence: Secondary | ICD-10-CM | POA: Diagnosis not present

## 2023-09-03 DIAGNOSIS — G4733 Obstructive sleep apnea (adult) (pediatric): Secondary | ICD-10-CM | POA: Diagnosis not present

## 2023-09-03 DIAGNOSIS — R400 Somnolence: Secondary | ICD-10-CM | POA: Diagnosis not present

## 2023-09-19 ENCOUNTER — Other Ambulatory Visit: Payer: Self-pay | Admitting: Internal Medicine

## 2023-10-04 DIAGNOSIS — R400 Somnolence: Secondary | ICD-10-CM | POA: Diagnosis not present

## 2023-10-04 DIAGNOSIS — G4733 Obstructive sleep apnea (adult) (pediatric): Secondary | ICD-10-CM | POA: Diagnosis not present

## 2023-10-11 DIAGNOSIS — H5201 Hypermetropia, right eye: Secondary | ICD-10-CM | POA: Diagnosis not present

## 2023-10-17 ENCOUNTER — Ambulatory Visit (HOSPITAL_BASED_OUTPATIENT_CLINIC_OR_DEPARTMENT_OTHER): Admitting: Nurse Practitioner

## 2023-11-03 DIAGNOSIS — R400 Somnolence: Secondary | ICD-10-CM | POA: Diagnosis not present

## 2023-11-04 ENCOUNTER — Encounter (HOSPITAL_BASED_OUTPATIENT_CLINIC_OR_DEPARTMENT_OTHER): Payer: Self-pay | Admitting: *Deleted

## 2023-11-08 DIAGNOSIS — E785 Hyperlipidemia, unspecified: Secondary | ICD-10-CM | POA: Diagnosis not present

## 2023-11-08 DIAGNOSIS — I480 Paroxysmal atrial fibrillation: Secondary | ICD-10-CM | POA: Diagnosis not present

## 2023-11-08 DIAGNOSIS — I4729 Other ventricular tachycardia: Secondary | ICD-10-CM | POA: Diagnosis not present

## 2023-11-09 NOTE — Progress Notes (Unsigned)
 Cardiology Office Note   Date:  11/14/2023  ID:  Alejandro Hayes, DOB 05/11/1970, MRN [id number] PCP: Teresa Channel, MD  [facility] Providers Cardiologist:  None Electrophysiologist:  Elspeth Sage, MD (Inactive)     PMH Syncope Paroxysmal atrial fibrillation DCCV 07/2012 SVT PVCs NSVT Hyperlipidemia Coronary calcium score of 0 on CT 03/2014 Low risk exercise myoview  01/2020 Snoring OSA on CPAP  Seen by Dr. Sage in 2014 noting spells of heart racing, and lightheadedness particularly while exercising.  He noted a sensation of needing to catch his breath particularly when preparing to serve a tennis ball.  He had an episode of syncope while swimming and spending time in the steam room afterwards.  He was walking out of the steam room lost consciousness and fell against the wall.  Exercise treadmill test and echocardiogram done with Dr. Jeffrie at [place], both normal.  Event monitor revealed sinus tachycardia, PVC, and nonsustained ventricular tachycardia for morphology suggestive of a left bundle inferior axis.  He was placed on diltiazem .  Diltiazem  was later changed to metoprolol . Signal averaged ECG was ordered.  He was noted to be in A-fib 07/2012 and was placed on Xarelto  and underwent cardioversion.  Repeat cardiac monitor 08/2020 predominant underlying rhythm sinus rhythm, 2 runs of ventricular tachycardia, fastest and longest lasting 10 beats with average 150 bpm, 2 runs of SVT longest lasting 8 beats, no sustained ectopy, no symptoms associated with sinus rhythm.  [place] sleep study 06/2023 revealed mild sleep apnea with recommendation for CPAP with nasal pillow and heated humidity.  Seen by me on 07/05/23 to discuss cardiovascular risk. History of VT with symptoms that presented when he was very active with regular tennis and cardiovascular exercise. One episode of syncope prior to diagnosis; no further episodes of presyncope or syncope. He is concerned about his rising  cholesterol levels, with recent measurements showing LDL at 138 mg/dL, HDL at 40 mg/dL, and triglycerides at 854 mg/dL. He is making dietary changes to manage his cholesterol and weight, replacing processed snacks with fruits and eating a more Mediterranean style diet. He admits he has not been active recently due to work commitments. 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 Stress from managing the business and family responsibilities affects his sleep and overall health. Home sleep study indicates mild sleep apnea and he is awaiting CPAP equipment. He often wakes at night due to stress and frequent urination.  Family history not significant for early CAD. His family history includes his mother with congestive heart failure and his father who had a mini-stroke. Both parents were on cholesterol and high blood pressure medications.  He denies chest pain, shortness of breath, palpitations, orthopnea, PND, edema. No further evidence of a fib since cardioversion 2014. History of CT calcium score of 0 in 2016, normal exercise Myoview  in 2022.   History of Present Illness Discussed the use of AI scribe software for clinical note transcription with the patient, who gave verbal consent to proceed.  History of Present Illness Alejandro Hayes is a very pleasant 53 year old male who is here for follow-up of dyslipidemia.  He is concerned about his recent lab results, which show a high LDL particle count of 1900, LDL cholesterol of 145, low HDL, and triglycerides at 868. He has a family history of heart disease and genetic factors affecting his cholesterol levels. Had calcium score of zero 2016. Notes he has not been as active with exercise as  he had hoped. During a mountain biking episode, he experienced a rapid heart rate, which he attributes to missing a dose of metoprolol  and consuming espresso. This has occurred before, and he believes it is due to being  deconditioned. He denies chest pain or shortness of breath, except during the rapid heart rate episode. Maintaining regular exercise has been challenging due to caring for his aging mother and work commitments. He is making healthier dietary choices but struggles with late-night snacking and insufficient physical activity.   ASCVD (Atherosclerotic Cardiovascular Disease) 2013 Risk Calculator from AHA/ACC from [email or web address]  on 07/05/2023 ** All calculations should be rechecked by clinician prior to use **  Moderate-intensity statin recommended because of 10-year risk between 5-7.5%. To view statin dosages by intensity, see Evidence section.  5.1%   Risk of cardiovascular event (coronary or stroke death or non-fatal MI or stroke) in next 10 years.  2.9%   10-year cardiovascular risk if risk factors were optimal.  INPUTS: Age --> 52 years Diabetes --> 0 = No Sex --> 1 = Male Smoker --> 0 = No Total cholesterol --> 204 mg/dL HDL cholesterol --> 40 mg/dL Systolic blood pressure --> 118 mm Hg Treatment for hypertension --> 0 = No Race --> 1 = White  Diet Mediterranean, but eats out often Etoh occasionally  Activity Owns a trucking company Sometimes more active than others No exercise currently  ROS: See HPI  Studies Reviewed      Lipoprotein (a)  Date/Time Value Ref Range Status  11/08/2023 09:14 AM 11.5 <75.0 nmol/L Final    Comment:    Note:  Values greater than or equal to 75.0 nmol/L may        indicate an independent risk factor for CHD,        but must be evaluated with caution when applied        to non-Caucasian populations due to the        influence of genetic factors on Lp(a) across        ethnicities.            STOP-Bang Score:  6      Physical Exam VS:  BP 120/74 (BP Location: Left Arm, Patient Position: Sitting, Cuff Size: Normal)   Pulse (!) 6   Ht 6' (1.829 m)   Wt 221 lb 1.6 oz (100.3 kg)   SpO2 96%   BMI 29.99 kg/m    Wt Readings from Last 3  Encounters:  11/14/23 221 lb 1.6 oz (100.3 kg)  07/05/23 221 lb 3.2 oz (100.3 kg)  06/10/23 220 lb (99.8 kg)    GEN: Well nourished, well developed in no acute distress NECK: No JVD; No carotid bruits CARDIAC: RRR, no murmurs, rubs, gallops RESPIRATORY:  Clear to auscultation without rales, wheezing or rhonchi  ABDOMEN: Soft, non-tender, non-distended EXTREMITIES:  No edema; No deformity    Assessment & Plan Cardiac Risk Counseling Hyperlipidemia Lipid panel completed 11/08/2023 with LDL particle #1908, LDL-C 854, HDL-C 37, triglycerides 131, total cholesterol 206, and small LDL-P 1369.  He is concerned about elevated cholesterol levels and ASCVD risk. 10-year ASCVD risk score of 5.1%. Calcium score was 0 in 2016.  We discussed the need to initiate cholesterol management given additional risk factors of hypertension and significant hyperlipidemia.    - Start rosuvastatin 10 mg daily -Will get repeat CT calcium score for further risk stratification -Recheck lipids in 2 to 3 months -Continue regular physical activity and aim for at least 150 minutes  of moderate intensity exercise each week - Recommend weight training at least 2-3 times a week for 20-30 minutes - Advised max HR 220 minus age - aim for 80-85% max HR  - Continue metoprolol  - Heart healthy diet like Mediterranean style diet avoiding processed foods, saturated fat, sugar, and other simple carbohydrates  Ventricular tachycardia   History of ventricular tachycardia with previous syncope.  He is on metoprolol  succinate 25 mg daily.  No recent tachypalpitations, presyncope, or syncope.  Moderate exercise is encouraged with caution to avoid prolonged extreme exercise. - Continue metoprolol  - Continue routine follow-up with EP cardiology  Sleep apnea   He has mild sleep apnea with 5.8-5.9 apneic episodes per hour and is awaiting CPAP. Follow up with the medical supply company for CPAP. Weight management and lifestyle changes are  encouraged to improve sleep apnea. Alcohol intake should be limited, especially before bedtime. - Management per sleep team  Essential hypertension   BP is well controlled.  - Continue metoprolol   - Encourage regular physical activity to improve conditioning and potentially improve HR and BP regulation - Avoid high sodium and processed foods        Disposition: 6 months with me  Signed, Rosaline Bane, NP-C

## 2023-11-10 ENCOUNTER — Ambulatory Visit: Payer: Self-pay | Admitting: Nurse Practitioner

## 2023-11-10 LAB — LIPOPROTEIN A (LPA): Lipoprotein (a): 11.5 nmol/L (ref <75.0)

## 2023-11-10 LAB — NMR, LIPOPROFILE
Cholesterol, Total: 206 mg/dL — ABNORMAL HIGH (ref 100–199)
HDL Particle Number: 26.3 umol/L — ABNORMAL LOW (ref >=30.5)
HDL-C: 37 mg/dL — ABNORMAL LOW (ref >39)
LDL Particle Number: 1908 nmol/L — ABNORMAL HIGH (ref <1000)
LDL Size: 20.1 nm — ABNORMAL LOW (ref >20.5)
LDL-C (NIH Calc): 145 mg/dL — ABNORMAL HIGH (ref 0–99)
LP-IR Score: 59 — ABNORMAL HIGH (ref <=45)
Small LDL Particle Number: 1369 nmol/L — ABNORMAL HIGH (ref <=527)
Triglycerides: 131 mg/dL (ref 0–149)

## 2023-11-11 ENCOUNTER — Encounter (HOSPITAL_BASED_OUTPATIENT_CLINIC_OR_DEPARTMENT_OTHER): Payer: Self-pay

## 2023-11-14 ENCOUNTER — Ambulatory Visit (INDEPENDENT_AMBULATORY_CARE_PROVIDER_SITE_OTHER): Admitting: Nurse Practitioner

## 2023-11-14 VITALS — BP 120/74 | HR 6 | Ht 72.0 in | Wt 221.1 lb

## 2023-11-14 DIAGNOSIS — I4729 Other ventricular tachycardia: Secondary | ICD-10-CM | POA: Diagnosis not present

## 2023-11-14 DIAGNOSIS — Z7189 Other specified counseling: Secondary | ICD-10-CM

## 2023-11-14 DIAGNOSIS — I10 Essential (primary) hypertension: Secondary | ICD-10-CM

## 2023-11-14 DIAGNOSIS — G4733 Obstructive sleep apnea (adult) (pediatric): Secondary | ICD-10-CM

## 2023-11-14 DIAGNOSIS — E785 Hyperlipidemia, unspecified: Secondary | ICD-10-CM | POA: Diagnosis not present

## 2023-11-14 MED ORDER — ROSUVASTATIN CALCIUM 10 MG PO TABS: 10.0000 mg | ORAL_TABLET | Freq: Every day | ORAL | 3 refills | Status: AC

## 2023-11-14 NOTE — Patient Instructions (Signed)
 Medication Instructions:   START Rosuvastatin one (1) tablet ( 10 mg) by mouth daily.   *If you need a refill on your cardiac medications before your next appointment, please call your pharmacy*  Lab Work:  Your physician recommends that you return for a FASTING NMR/ALT, fasting after midnight. Patient given paperwork today.   If you have labs (blood work) drawn today and your tests are completely normal, you will receive your results only by: MyChart Message (if you have MyChart) OR A paper copy in the mail If you have any lab test that is abnormal or we need to change your treatment, we will call you to review the results.  Testing/Procedures:   Rosaline Bane.,NP has ordered a CT coronary calcium score.   Test locations:   [facility] [place]   This is $99 out of pocket.   Coronary CalciumScan A coronary calcium scan is an imaging test used to look for deposits of calcium and other fatty materials (plaques) in the inner lining of the blood vessels of the heart (coronary arteries). These deposits of calcium and plaques can partly clog and narrow the coronary arteries without producing any symptoms or warning signs. This puts a person at risk for a heart attack. This test can detect these deposits before symptoms develop. Tell a health care provider about: Any allergies you have. All medicines you are taking, including vitamins, herbs, eye drops, creams, and over-the-counter medicines. Any problems you or family members have had with anesthetic medicines. Any blood disorders you have. Any surgeries you have had. Any medical conditions you have. Whether you are pregnant or may be pregnant. What are the risks? Generally, this is a safe procedure. However, problems may occur, including: Harm to a pregnant woman and her unborn baby. This test involves the use of radiation. Radiation exposure can be dangerous to a pregnant woman and her unborn baby. If you are  pregnant, you generally should not have this procedure done. Slight increase in the risk of cancer. This is because of the radiation involved in the test. What happens before the procedure? No preparation is needed for this procedure. What happens during the procedure? You will undress and remove any jewelry around your neck or chest. You will put on a hospital gown. Sticky electrodes will be placed on your chest. The electrodes will be connected to an electrocardiogram (ECG) machine to record a tracing of the electrical activity of your heart. A CT scanner will take pictures of your heart. During this time, you will be asked to lie still and hold your breath for 2-3 seconds while a picture of your heart is being taken. The procedure may vary among health care providers and hospitals. What happens after the procedure? You can get dressed. You can return to your normal activities. It is up to you to get the results of your test. Ask your health care provider, or the department that is doing the test, when your results will be ready. Summary A coronary calcium scan is an imaging test used to look for deposits of calcium and other fatty materials (plaques) in the inner lining of the blood vessels of the heart (coronary arteries). Generally, this is a safe procedure. Tell your health care provider if you are pregnant or may be pregnant. No preparation is needed for this procedure. A CT scanner will take pictures of your heart. You can return to your normal activities after the scan is done. This information is not intended to replace  advice given to you by your health care provider. Make sure you discuss any questions you have with your health care provider. Document Released: 06/19/2007 Document Revised: 11/10/2015 Document Reviewed: 11/10/2015 Elsevier Interactive Patient Education  2017 [employer].   Follow-Up: At [facility], you and your health needs are our priority.  As part  of our continuing mission to provide you with exceptional heart care, our providers are all part of one team.  This team includes your primary Cardiologist (physician) and Advanced Practice Providers or APPs (Physician Assistants and Nurse Practitioners) who all work together to provide you with the care you need, when you need it.  Your next appointment:   6 month(s)  Provider:   Rosaline Bane, NP    We recommend signing up for the patient portal called MyChart.  Sign up information is provided on this After Visit Summary.  MyChart is used to connect with patients for Virtual Visits (Telemedicine).  Patients are able to view lab/test results, encounter notes, upcoming appointments, etc.  Non-urgent messages can be sent to your provider as well.   To learn more about what you can do with MyChart, go to [email or web address].   Other Instructions  Your physician wants you to follow-up in: 6 months.  You will receive a reminder letter in the mail two months in advance. If you don't receive a letter, please call our office to schedule the follow-up appointment.

## 2023-11-15 ENCOUNTER — Encounter (HOSPITAL_BASED_OUTPATIENT_CLINIC_OR_DEPARTMENT_OTHER): Payer: Self-pay | Admitting: Nurse Practitioner

## 2023-11-25 ENCOUNTER — Ambulatory Visit (HOSPITAL_BASED_OUTPATIENT_CLINIC_OR_DEPARTMENT_OTHER)
Admission: RE | Admit: 2023-11-25 | Discharge: 2023-11-25 | Disposition: A | Discharge status: Home / Self Care | Payer: Self-pay | Source: Ambulatory Visit | Attending: Nurse Practitioner | Admitting: Nurse Practitioner

## 2023-11-25 ENCOUNTER — Ambulatory Visit (HOSPITAL_BASED_OUTPATIENT_CLINIC_OR_DEPARTMENT_OTHER): Payer: Self-pay | Admitting: Family

## 2023-11-25 DIAGNOSIS — E785 Hyperlipidemia, unspecified: Secondary | ICD-10-CM | POA: Insufficient documentation

## 2023-11-25 DIAGNOSIS — Z7189 Other specified counseling: Secondary | ICD-10-CM | POA: Insufficient documentation

## 2024-01-11 ENCOUNTER — Other Ambulatory Visit: Payer: Self-pay | Admitting: Medical Genetics

## 2024-04-10 ENCOUNTER — Other Ambulatory Visit
